# Patient Record
Sex: Female | Born: 1935 | Race: White | Hispanic: No | Marital: Married | Smoking: Former smoker
Health system: Southern US, Community
[De-identification: ages and names within clinical notes are randomized; demographics above are authoritative.]

## PROBLEM LIST (undated history)

## (undated) DIAGNOSIS — R0602 Shortness of breath: Secondary | ICD-10-CM

## (undated) DIAGNOSIS — F341 Dysthymic disorder: Secondary | ICD-10-CM

## (undated) DIAGNOSIS — M1990 Unspecified osteoarthritis, unspecified site: Secondary | ICD-10-CM

## (undated) DIAGNOSIS — E785 Hyperlipidemia, unspecified: Secondary | ICD-10-CM

## (undated) DIAGNOSIS — H6123 Impacted cerumen, bilateral: Secondary | ICD-10-CM

## (undated) DIAGNOSIS — H269 Unspecified cataract: Secondary | ICD-10-CM

## (undated) DIAGNOSIS — Z972 Presence of dental prosthetic device (complete) (partial): Secondary | ICD-10-CM

## (undated) DIAGNOSIS — N943 Premenstrual tension syndrome: Secondary | ICD-10-CM

## (undated) DIAGNOSIS — I10 Essential (primary) hypertension: Secondary | ICD-10-CM

## (undated) HISTORY — DX: Unspecified osteoarthritis, unspecified site: M19.90

## (undated) HISTORY — DX: Impacted cerumen, bilateral: H61.23

## (undated) HISTORY — PX: DILATION AND CURETTAGE OF UTERUS: SHX78

## (undated) HISTORY — PX: THYROID LOBECTOMY: SHX420

## (undated) HISTORY — DX: Shortness of breath: R06.02

## (undated) HISTORY — DX: Premenstrual tension syndrome: N94.3

## (undated) HISTORY — PX: COLONOSCOPY: SHX174

## (undated) HISTORY — DX: Dysthymic disorder: F34.1

## (undated) HISTORY — PX: WISDOM TOOTH EXTRACTION: SHX21

## (undated) HISTORY — PX: TONSILLECTOMY: SUR1361

## (undated) HISTORY — DX: Hyperlipidemia, unspecified: E78.5

## (undated) HISTORY — DX: Unspecified cataract: H26.9

## (undated) HISTORY — DX: Presence of dental prosthetic device (complete) (partial): Z97.2

## (undated) HISTORY — DX: Essential (primary) hypertension: I10

---

## 1998-10-11 ENCOUNTER — Other Ambulatory Visit: Admission: RE | Admit: 1998-10-11 | Discharge: 1998-10-11 | Payer: Self-pay | Admitting: Obstetrics and Gynecology

## 1999-09-16 ENCOUNTER — Ambulatory Visit (HOSPITAL_COMMUNITY): Admission: RE | Admit: 1999-09-16 | Discharge: 1999-09-16 | Payer: Self-pay | Admitting: *Deleted

## 1999-10-16 ENCOUNTER — Other Ambulatory Visit: Admission: RE | Admit: 1999-10-16 | Discharge: 1999-10-16 | Payer: Self-pay | Admitting: Obstetrics and Gynecology

## 1999-11-22 ENCOUNTER — Other Ambulatory Visit: Admission: RE | Admit: 1999-11-22 | Discharge: 1999-11-22 | Payer: Self-pay | Admitting: Obstetrics and Gynecology

## 1999-11-22 ENCOUNTER — Encounter (INDEPENDENT_AMBULATORY_CARE_PROVIDER_SITE_OTHER): Payer: Self-pay | Admitting: Specialist

## 2000-05-27 ENCOUNTER — Other Ambulatory Visit: Admission: RE | Admit: 2000-05-27 | Discharge: 2000-05-27 | Payer: Self-pay | Admitting: Obstetrics and Gynecology

## 2000-05-27 ENCOUNTER — Encounter (INDEPENDENT_AMBULATORY_CARE_PROVIDER_SITE_OTHER): Payer: Self-pay | Admitting: Specialist

## 2000-06-24 ENCOUNTER — Encounter: Payer: Self-pay | Admitting: *Deleted

## 2000-06-24 ENCOUNTER — Encounter: Admission: RE | Admit: 2000-06-24 | Discharge: 2000-06-24 | Payer: Self-pay | Admitting: *Deleted

## 2000-06-30 ENCOUNTER — Encounter: Payer: Self-pay | Admitting: *Deleted

## 2000-06-30 ENCOUNTER — Encounter: Admission: RE | Admit: 2000-06-30 | Discharge: 2000-06-30 | Payer: Self-pay | Admitting: *Deleted

## 2000-07-31 ENCOUNTER — Encounter: Payer: Self-pay | Admitting: *Deleted

## 2000-07-31 ENCOUNTER — Encounter: Admission: RE | Admit: 2000-07-31 | Discharge: 2000-07-31 | Payer: Self-pay | Admitting: *Deleted

## 2000-10-20 ENCOUNTER — Other Ambulatory Visit: Admission: RE | Admit: 2000-10-20 | Discharge: 2000-10-20 | Payer: Self-pay | Admitting: Obstetrics and Gynecology

## 2001-08-05 ENCOUNTER — Ambulatory Visit: Admission: RE | Admit: 2001-08-05 | Discharge: 2001-08-05 | Payer: Self-pay | Admitting: Family Medicine

## 2001-08-05 ENCOUNTER — Encounter: Payer: Self-pay | Admitting: Pulmonary Disease

## 2001-08-05 ENCOUNTER — Encounter: Payer: Self-pay | Admitting: Family Medicine

## 2001-11-08 ENCOUNTER — Other Ambulatory Visit: Admission: RE | Admit: 2001-11-08 | Discharge: 2001-11-08 | Payer: Self-pay | Admitting: Obstetrics and Gynecology

## 2002-12-20 ENCOUNTER — Other Ambulatory Visit: Admission: RE | Admit: 2002-12-20 | Discharge: 2002-12-20 | Payer: Self-pay | Admitting: Obstetrics and Gynecology

## 2004-02-07 ENCOUNTER — Other Ambulatory Visit: Admission: RE | Admit: 2004-02-07 | Discharge: 2004-02-07 | Payer: Self-pay | Admitting: Obstetrics and Gynecology

## 2004-10-07 ENCOUNTER — Ambulatory Visit: Payer: Self-pay | Admitting: Family Medicine

## 2005-01-14 ENCOUNTER — Ambulatory Visit: Payer: Self-pay | Admitting: Family Medicine

## 2005-05-12 ENCOUNTER — Ambulatory Visit: Payer: Self-pay | Admitting: Family Medicine

## 2005-08-26 ENCOUNTER — Ambulatory Visit: Payer: Self-pay | Admitting: Family Medicine

## 2005-10-02 ENCOUNTER — Ambulatory Visit: Payer: Self-pay | Admitting: Family Medicine

## 2005-11-13 ENCOUNTER — Encounter: Admission: RE | Admit: 2005-11-13 | Discharge: 2005-11-13 | Payer: Self-pay | Admitting: Family Medicine

## 2005-11-17 ENCOUNTER — Ambulatory Visit: Payer: Self-pay | Admitting: Family Medicine

## 2005-12-15 ENCOUNTER — Ambulatory Visit: Payer: Self-pay | Admitting: Family Medicine

## 2005-12-31 ENCOUNTER — Ambulatory Visit: Payer: Self-pay | Admitting: Family Medicine

## 2006-03-09 ENCOUNTER — Ambulatory Visit: Payer: Self-pay | Admitting: Family Medicine

## 2006-07-27 ENCOUNTER — Ambulatory Visit: Payer: Self-pay | Admitting: Family Medicine

## 2006-08-06 ENCOUNTER — Ambulatory Visit: Payer: Self-pay | Admitting: Family Medicine

## 2007-02-23 ENCOUNTER — Ambulatory Visit: Payer: Self-pay | Admitting: Family Medicine

## 2007-02-23 LAB — CONVERTED CEMR LAB
ALT: 20 units/L (ref 0–40)
AST: 30 units/L (ref 0–37)
Albumin: 4.4 g/dL (ref 3.5–5.2)
Alkaline Phosphatase: 60 units/L (ref 39–117)
BUN: 10 mg/dL (ref 6–23)
Basophils Absolute: 0 10*3/uL (ref 0.0–0.1)
Basophils Relative: 0.2 % (ref 0.0–1.0)
Bilirubin, Direct: 0.1 mg/dL (ref 0.0–0.3)
CO2: 32 meq/L (ref 19–32)
Calcium: 9.7 mg/dL (ref 8.4–10.5)
Chloride: 101 meq/L (ref 96–112)
Cholesterol: 188 mg/dL (ref 0–200)
Creatinine, Ser: 0.8 mg/dL (ref 0.4–1.2)
Eosinophils Absolute: 0.3 10*3/uL (ref 0.0–0.6)
Eosinophils Relative: 7.7 % — ABNORMAL HIGH (ref 0.0–5.0)
GFR calc Af Amer: 91 mL/min
GFR calc non Af Amer: 75 mL/min
Glucose, Bld: 95 mg/dL (ref 70–99)
HCT: 40.6 % (ref 36.0–46.0)
HDL: 73.1 mg/dL (ref >39.0)
Hemoglobin: 14 g/dL (ref 12.0–15.0)
LDL Cholesterol: 99 mg/dL (ref 0–99)
Lymphocytes Relative: 27.3 % (ref 12.0–46.0)
MCHC: 34.5 g/dL (ref 30.0–36.0)
MCV: 96.2 fL (ref 78.0–100.0)
Monocytes Absolute: 0.4 10*3/uL (ref 0.2–0.7)
Monocytes Relative: 8.4 % (ref 3.0–11.0)
Neutro Abs: 2.6 10*3/uL (ref 1.4–7.7)
Neutrophils Relative %: 56.4 % (ref 43.0–77.0)
Platelets: 249 10*3/uL (ref 150–400)
Potassium: 4.1 meq/L (ref 3.5–5.1)
RBC: 4.22 M/uL (ref 3.87–5.11)
RDW: 12.1 % (ref 11.5–14.6)
Sodium: 138 meq/L (ref 135–145)
TSH: 2.49 microintl units/mL (ref 0.35–5.50)
Total Bilirubin: 1.1 mg/dL (ref 0.3–1.2)
Total CHOL/HDL Ratio: 2.6
Total Protein: 7.3 g/dL (ref 6.0–8.3)
Triglycerides: 78 mg/dL (ref 0–149)
VLDL: 16 mg/dL (ref 0–40)
WBC: 4.5 10*3/uL (ref 4.5–10.5)

## 2007-08-09 DIAGNOSIS — J309 Allergic rhinitis, unspecified: Secondary | ICD-10-CM | POA: Insufficient documentation

## 2007-08-09 DIAGNOSIS — I10 Essential (primary) hypertension: Secondary | ICD-10-CM

## 2007-08-09 DIAGNOSIS — M1990 Unspecified osteoarthritis, unspecified site: Secondary | ICD-10-CM | POA: Insufficient documentation

## 2007-09-28 ENCOUNTER — Ambulatory Visit: Payer: Self-pay | Admitting: Family Medicine

## 2007-09-28 DIAGNOSIS — H6120 Impacted cerumen, unspecified ear: Secondary | ICD-10-CM

## 2008-02-07 ENCOUNTER — Encounter: Payer: Self-pay | Admitting: Family Medicine

## 2008-02-08 ENCOUNTER — Ambulatory Visit: Payer: Self-pay | Admitting: Family Medicine

## 2008-02-08 DIAGNOSIS — E785 Hyperlipidemia, unspecified: Secondary | ICD-10-CM

## 2008-02-08 LAB — CONVERTED CEMR LAB
ALT: 23 U/L
AST: 28 U/L
Albumin: 4.1 g/dL
Alkaline Phosphatase: 63 U/L
BUN: 14 mg/dL
Basophils Absolute: 0 K/uL
Basophils Relative: 0.9 %
Bilirubin Urine: NEGATIVE
Bilirubin, Direct: 0.2 mg/dL
Blood in Urine, dipstick: NEGATIVE
CO2: 30 meq/L
Calcium: 9.8 mg/dL
Chloride: 101 meq/L
Cholesterol: 182 mg/dL
Creatinine, Ser: 0.8 mg/dL
Eosinophils Absolute: 0.6 K/uL
Eosinophils Relative: 13.8 % — ABNORMAL HIGH
GFR calc Af Amer: 91 mL/min
GFR calc non Af Amer: 75 mL/min
Glucose, Bld: 91 mg/dL
Glucose, Urine, Semiquant: NEGATIVE
HCT: 41.2 %
HDL: 56.7 mg/dL
Hemoglobin: 13.8 g/dL
Ketones, urine, test strip: NEGATIVE
LDL Cholesterol: 109 mg/dL — ABNORMAL HIGH
Lymphocytes Relative: 31.2 %
MCHC: 33.5 g/dL
MCV: 96.1 fL
Monocytes Absolute: 0.4 K/uL
Monocytes Relative: 9 %
Neutro Abs: 1.9 K/uL
Neutrophils Relative %: 45.1 %
Nitrite: NEGATIVE
Platelets: 244 K/uL
Potassium: 4.9 meq/L
Protein, U semiquant: NEGATIVE
RBC: 4.29 M/uL
RDW: 11.2 % — ABNORMAL LOW
Sodium: 137 meq/L
Specific Gravity, Urine: 1.015
TSH: 3.17 u[IU]/mL
Total Bilirubin: 0.9 mg/dL
Total CHOL/HDL Ratio: 3.2
Total Protein: 7.1 g/dL
Triglycerides: 81 mg/dL
Urobilinogen, UA: 0.2
VLDL: 16 mg/dL
WBC Urine, dipstick: NEGATIVE
WBC: 4.2 10*3/microliter — ABNORMAL LOW
pH: 8

## 2008-02-25 ENCOUNTER — Telehealth: Payer: Self-pay | Admitting: Family Medicine

## 2008-03-02 ENCOUNTER — Ambulatory Visit: Payer: Self-pay | Admitting: Family Medicine

## 2008-07-12 ENCOUNTER — Ambulatory Visit: Payer: Self-pay | Admitting: Family Medicine

## 2008-07-12 DIAGNOSIS — R0602 Shortness of breath: Secondary | ICD-10-CM | POA: Insufficient documentation

## 2008-07-12 DIAGNOSIS — F341 Dysthymic disorder: Secondary | ICD-10-CM

## 2008-07-27 ENCOUNTER — Ambulatory Visit: Payer: Self-pay | Admitting: Family Medicine

## 2008-08-02 ENCOUNTER — Ambulatory Visit: Payer: Self-pay | Admitting: Cardiology

## 2008-08-18 ENCOUNTER — Ambulatory Visit: Payer: Self-pay | Admitting: Cardiology

## 2009-01-25 ENCOUNTER — Encounter: Payer: Self-pay | Admitting: Family Medicine

## 2009-02-09 ENCOUNTER — Encounter: Payer: Self-pay | Admitting: Cardiology

## 2009-02-09 ENCOUNTER — Ambulatory Visit: Payer: Self-pay | Admitting: Cardiology

## 2009-02-23 ENCOUNTER — Ambulatory Visit (HOSPITAL_COMMUNITY): Admission: RE | Admit: 2009-02-23 | Discharge: 2009-02-23 | Payer: Self-pay | Admitting: Cardiology

## 2009-02-23 ENCOUNTER — Encounter: Payer: Self-pay | Admitting: Cardiology

## 2009-02-23 ENCOUNTER — Ambulatory Visit: Payer: Self-pay | Admitting: Internal Medicine

## 2009-02-23 ENCOUNTER — Encounter: Payer: Self-pay | Admitting: Pulmonary Disease

## 2009-05-14 ENCOUNTER — Telehealth: Payer: Self-pay | Admitting: Family Medicine

## 2009-06-20 ENCOUNTER — Encounter (INDEPENDENT_AMBULATORY_CARE_PROVIDER_SITE_OTHER): Payer: Self-pay | Admitting: *Deleted

## 2009-08-06 ENCOUNTER — Ambulatory Visit: Payer: Self-pay | Admitting: Family Medicine

## 2009-08-06 LAB — CONVERTED CEMR LAB
AST: 28 units/L (ref 0–37)
BUN: 14 mg/dL (ref 6–23)
Basophils Absolute: 0 10*3/uL (ref 0.0–0.1)
Bilirubin Urine: NEGATIVE
Calcium: 9.7 mg/dL (ref 8.4–10.5)
Cholesterol: 163 mg/dL (ref 0–200)
Creatinine, Ser: 0.7 mg/dL (ref 0.4–1.2)
GFR calc non Af Amer: 87.16 mL/min (ref >60)
Glucose, Bld: 113 mg/dL — ABNORMAL HIGH (ref 70–99)
Glucose, Urine, Semiquant: NEGATIVE
HCT: 41.3 % (ref 36.0–46.0)
HDL: 55.8 mg/dL (ref >39.00)
Ketones, urine, test strip: NEGATIVE
Lymphocytes Relative: 26.7 % (ref 12.0–46.0)
Lymphs Abs: 1.3 10*3/uL (ref 0.7–4.0)
Monocytes Relative: 10.4 % (ref 3.0–12.0)
Neutrophils Relative %: 54.2 % (ref 43.0–77.0)
Nitrite: NEGATIVE
Platelets: 234 10*3/uL (ref 150.0–400.0)
RDW: 12.1 % (ref 11.5–14.6)
Specific Gravity, Urine: 1.015
TSH: 2.82 microintl units/mL (ref 0.35–5.50)
Total Bilirubin: 1 mg/dL (ref 0.3–1.2)
Triglycerides: 86 mg/dL (ref 0.0–149.0)
Urobilinogen, UA: 0.2
VLDL: 17.2 mg/dL (ref 0.0–40.0)
WBC Urine, dipstick: NEGATIVE
pH: 8.5

## 2009-08-09 ENCOUNTER — Telehealth: Payer: Self-pay | Admitting: Family Medicine

## 2009-08-10 ENCOUNTER — Ambulatory Visit: Payer: Self-pay | Admitting: Family Medicine

## 2009-08-16 ENCOUNTER — Telehealth: Payer: Self-pay | Admitting: Family Medicine

## 2009-08-17 ENCOUNTER — Ambulatory Visit: Payer: Self-pay | Admitting: Pulmonary Disease

## 2009-08-26 ENCOUNTER — Encounter: Payer: Self-pay | Admitting: Pulmonary Disease

## 2009-08-28 ENCOUNTER — Ambulatory Visit: Payer: Self-pay | Admitting: Cardiology

## 2009-08-28 DIAGNOSIS — R002 Palpitations: Secondary | ICD-10-CM | POA: Insufficient documentation

## 2009-08-30 ENCOUNTER — Telehealth: Payer: Self-pay | Admitting: Family Medicine

## 2009-09-21 ENCOUNTER — Ambulatory Visit: Payer: Self-pay | Admitting: Pulmonary Disease

## 2009-09-21 DIAGNOSIS — J45909 Unspecified asthma, uncomplicated: Secondary | ICD-10-CM | POA: Insufficient documentation

## 2009-10-03 ENCOUNTER — Ambulatory Visit: Payer: Self-pay | Admitting: Cardiology

## 2009-10-07 LAB — CONVERTED CEMR LAB
CO2: 24 meq/L (ref 19–32)
Chloride: 101 meq/L (ref 96–112)
Creatinine, Ser: 0.9 mg/dL (ref 0.4–1.2)

## 2009-12-21 ENCOUNTER — Encounter: Payer: Self-pay | Admitting: Cardiology

## 2009-12-25 ENCOUNTER — Ambulatory Visit: Payer: Self-pay | Admitting: Cardiology

## 2010-02-11 ENCOUNTER — Encounter: Payer: Self-pay | Admitting: Family Medicine

## 2010-11-03 DIAGNOSIS — H269 Unspecified cataract: Secondary | ICD-10-CM

## 2010-11-03 HISTORY — PX: OTHER SURGICAL HISTORY: SHX169

## 2010-11-03 HISTORY — DX: Unspecified cataract: H26.9

## 2010-12-03 NOTE — Miscellaneous (Signed)
  Clinical Lists Changes  Observations: Added new observation of PFT RSLT: 1. Mild obstruciton by FEV, and no response to bronchodilator 2. No restrictions, but no airtrapping noted 3. DLCO is normal. (09/21/2009 11:05)      PFT  Procedure date:  09/21/2009  Findings:      1. Mild obstruciton by FEV, and no response to bronchodilator 2. No restrictions, but no airtrapping noted 3. DLCO is normal.

## 2010-12-03 NOTE — Assessment & Plan Note (Signed)
Summary: 4 month rov/sl  Medications Added LOSARTAN POTASSIUM 100 MG TABS (LOSARTAN POTASSIUM) one daily      Allergies Added:   Visit Type:  Follow-up Primary Provider:  Roderick Pee MD  CC:  Dyspnea.  History of Present Illness: The patient presents for followup of her hypertension and dyspnea. She continues to complain about dyspnea with exertion. She has seen Dr. Shelle Iron and is being treated for mild questionable asthma. She also had a distant smoking history which may contribute. Cardiopulmonary stress testing has not suggested a cardiac etiology other than possible diastolic dysfunction. Her BNP has been slightly elevated. Her blood pressures have been slightly elevated. At the last visit I added Aldactone and her blood pressures have come down to consistently be below 140/90 and in fact in the mornings the systolics are less than 110 and she feels slightly lightheaded. She also reports that she is "dry" and needing to drink fluids. Despite dual diuretics and blood pressure lowering she has not had improvement in her dyspnea. It is no worse. She is not having PND or orthopnea. She is not having chest pressure, neck or arm discomfort. She has no palpitations, presyncope or syncope and is having no edema.  Current Medications (verified): 1)  Bayer Aspirin 325 Mg  Tabs (Aspirin) .... Take 2 Once Daily 2)  Hyzaar 100-25 Mg  Tabs (Losartan Potassium-Hctz) .... Take 1 Tablet By Mouth Once A Day 3)  Simvastatin 40 Mg Tabs (Simvastatin) .... Take 1 Once Dailypt Still Taking 20mg  Once Daily At Night 4)  Ca+ & Vit D 5)  Ginkgo 60 Mg Tabs (Ginkgo Biloba) 6)  Vit E 7)  Atenolol 50 Mg Tabs (Atenolol) .Marland Kitchen.. 1 At Bedtime 8)  Centrum Silver  Tabs (Multiple Vitamins-Minerals) .... Take 1 Once Daily 9)  Fish Oil 1200 Mg Caps (Omega-3 Fatty Acids) .... Take 1 Once Daily 10)  Atenolol 25 Mg Tabs (Atenolol) .Marland Kitchen.. 1 Qam 11)  Advil 200 Mg Tabs (Ibuprofen) .... Prn 12)  Spironolactone 25 Mg Tabs  (Spironolactone) .... One By Mouth Daily  Allergies (verified): 1)  ! Codeine  Past History:  Past Medical History: Reviewed history from 08/28/2009 and no changes required. DYSTHYMIC DISORDER (ICD-300.4) HYPERLIPIDEMIA (ICD-272.4) CERUMEN IMPACTION, BILATERAL (ICD-380.4) FAMILY HISTORY BREAST CANCER 1ST DEGREE RELATIVE <50 (ICD-V16.3) OSTEOARTHRITIS (ICD-715.90) HYPERTENSION (ICD-401.9) ALLERGIC RHINITIS (ICD-477.9) PMS Hearing Loss  Past Surgical History: Reviewed history from 08/09/2007 and no changes required. Colonoscopy CB x3 D/C x1 Lumpectomy - Thyroid  Review of Systems       As stated in the HPI and negative for all other systems.   Vital Signs:  Patient profile:   77 year old female Height:      62 inches Weight:      145 pounds BMI:     26.62 Pulse rate:   62 / minute Resp:     16 per minute BP sitting:   104 / 68  (right arm)  Vitals Entered By: Marrion Coy, CNA (December 25, 2009 8:49 AM)  Physical Exam  General:  Well developed, well nourished, in no acute distress. Head:  normocephalic and atraumatic Eyes:  PERRLA/EOM intact; conjunctiva and lids normal. Mouth:  Teeth, gums and palate normal. Oral mucosa normal. Neck:  Neck supple, no JVD. No masses, thyromegaly or abnormal cervical nodes. Chest Wall:  no deformities or breast masses noted Lungs:  Clear bilaterally to auscultation and percussion. Abdomen:  Bowel sounds positive; abdomen soft and non-tender without masses, organomegaly, or hernias noted. No hepatosplenomegaly.  Msk:  Back normal, normal gait. Muscle strength and tone normal. Extremities:  No clubbing or cyanosis. Neurologic:  Alert and oriented x 3. Skin:  Intact without lesions or rashes. Cervical Nodes:  no significant adenopathy Axillary Nodes:  no significant adenopathy Inguinal Nodes:  no significant adenopathy Psych:  Normal affect.   Detailed Cardiovascular Exam  Neck    Carotids: Carotids full and equal  bilaterally without bruits.      Neck Veins: Normal, no JVD.    Heart    Inspection: no deformities or lifts noted.      Palpation: normal PMI with no thrills palpable.      Auscultation: regular rate and rhythm, S1, S2 without murmurs, rubs, gallops, or clicks.    Vascular    Abdominal Aorta: no palpable masses, pulsations, or audible bruits.      Femoral Pulses: normal femoral pulses bilaterally.      Pedal Pulses: normal pedal pulses bilaterally.      Radial Pulses: normal radial pulses bilaterally.      Peripheral Circulation: no clubbing, cyanosis, or edema noted with normal capillary refill.     EKG  Procedure date:  12/25/2009  Findings:      sinus rhythm, rate 60 to, axis within normal limits, intervals within normal limits, premature atrial contractions, no acute ST-T wave changes.  Impression & Recommendations:  Problem # 1:  SOB (ICD-786.05) The etiology is probably multifactorial though I am disappointed that treatment of blood pressure and diastolic dysfunction with diuretics did not make a significant difference. At this point I will back off on her diuretics as described below. No further workup is planned.  Problem # 2:  HYPERTENSION (ICD-401.9)  I will discontinue the HCTZ and have her on one the diuretic only. Her blood pressures are better controlled on Aldactone. She will continue this.  Her updated medication list for this problem includes:    Bayer Aspirin 325 Mg Tabs (Aspirin) .Marland Kitchen... Take 2 once daily    Losartan Potassium 100 Mg Tabs (Losartan potassium) ..... One daily    Atenolol 50 Mg Tabs (Atenolol) .Marland Kitchen... 1 at bedtime    Atenolol 25 Mg Tabs (Atenolol) .Marland Kitchen... 1 qam    Spironolactone 25 Mg Tabs (Spironolactone) ..... One by mouth daily  Problem # 3:  PALPITATIONS (ICD-785.1) She is not particularly bothered by this and will continue with the meds as listed. Orders: EKG w/ Interpretation (93000)  Patient Instructions: 1)  Your physician recommends  that you schedule a follow-up appointment in: 12 months 2)  Your physician has recommended you make the following change in your medication:  Prescriptions: SPIRONOLACTONE 25 MG TABS (SPIRONOLACTONE) one by mouth daily  #90 x 3   Entered by:   Charolotte Capuchin, RN   Authorized by:   Rollene Rotunda, MD, [facility]   Signed by:   Charolotte Capuchin, RN on 12/25/2009   Method used:   Electronically to        [facility]  [phone]* (retail)       [address] [place], [place]  [address]       Ph: [phone] or [phone]       Fax: [phone]   RxID:   [id number] ATENOLOL 25 MG TABS (ATENOLOL) 1 qam  #90 x 3   Entered by:   Charolotte Capuchin, RN   Authorized by:   Rollene Rotunda, MD, [facility]   Signed by:   Charolotte Capuchin, RN on 12/25/2009  Method used:   Electronically to        [facility]  [phone]* (retail)       [address] [place], [place]  [address]       Ph: [phone] or [phone]       Fax: [phone]   RxID:   [id number] ATENOLOL 50 MG TABS (ATENOLOL) 1 at bedtime  #90 x 3   Entered by:   Charolotte Capuchin, RN   Authorized by:   Rollene Rotunda, MD, [facility]   Signed by:   Charolotte Capuchin, RN on 12/25/2009   Method used:   Electronically to        [facility]  [phone]* (retail)       [address] [place], [place]  [address]       Ph: [phone] or [phone]       Fax: [phone]   RxID:   [id number] SIMVASTATIN 40 MG TABS (SIMVASTATIN) Take 1 once dailypt still taking 20mg  once daily at night  #90 x 3   Entered by:   Charolotte Capuchin, RN   Authorized by:   Rollene Rotunda, MD, [facility]   Signed by:   Charolotte Capuchin, RN on 12/25/2009   Method used:   Electronically to        [facility]  [phone]* (retail)       [address] [place], [place]  [address]       Ph: [phone] or [phone]       Fax: [phone]   RxID:   [id number] LOSARTAN POTASSIUM 100 MG TABS  (LOSARTAN POTASSIUM) one daily  #90 x 3   Entered by:   Charolotte Capuchin, RN   Authorized by:   Rollene Rotunda, MD, [facility]   Signed by:   Charolotte Capuchin, RN on 12/25/2009   Method used:   Electronically to        [facility]  [phone]* (retail)       [address] [place], [place]  [address]       Ph: [phone] or [phone]       Fax: [phone]   RxID:   [id number]

## 2011-01-30 ENCOUNTER — Telehealth: Payer: Self-pay | Admitting: Cardiology

## 2011-01-30 DIAGNOSIS — I10 Essential (primary) hypertension: Secondary | ICD-10-CM

## 2011-01-30 MED ORDER — ATENOLOL 25 MG PO TABS: 25.0000 mg | ORAL_TABLET | Freq: Every day | ORAL | Status: DC

## 2011-01-30 NOTE — Telephone Encounter (Signed)
Called and told pharmacy to use #90 instead of #30

## 2011-01-30 NOTE — Telephone Encounter (Signed)
[facility] 25mg  90 days supply

## 2011-02-19 ENCOUNTER — Encounter: Payer: Self-pay | Admitting: Family Medicine

## 2011-03-04 ENCOUNTER — Encounter: Payer: Self-pay | Admitting: Cardiology

## 2011-03-06 ENCOUNTER — Ambulatory Visit (INDEPENDENT_AMBULATORY_CARE_PROVIDER_SITE_OTHER): Payer: Medicare Other | Admitting: Cardiology

## 2011-03-06 ENCOUNTER — Encounter: Payer: Self-pay | Admitting: Cardiology

## 2011-03-06 ENCOUNTER — Ambulatory Visit: Payer: Self-pay | Admitting: Cardiology

## 2011-03-06 VITALS — BP 130/76 | HR 64 | Ht 62.0 in | Wt 139.0 lb

## 2011-03-06 DIAGNOSIS — R0602 Shortness of breath: Secondary | ICD-10-CM

## 2011-03-06 DIAGNOSIS — I10 Essential (primary) hypertension: Secondary | ICD-10-CM

## 2011-03-06 DIAGNOSIS — R002 Palpitations: Secondary | ICD-10-CM

## 2011-03-06 LAB — BASIC METABOLIC PANEL
BUN: 9 mg/dL (ref 6–23)
Chloride: 96 mEq/L (ref 96–112)
Creatinine, Ser: 0.8 mg/dL (ref 0.4–1.2)
GFR: 80.14 mL/min (ref >60.00)

## 2011-03-06 NOTE — Progress Notes (Signed)
HPI Patient presents for followup of dyspnea hypertension. At the last visit I stopped hydrochlorothiazide started spironolactone. She has actually done better from a blood pressure standpoint with this. She has had her usual mild dyspnea but this is not worse than previous. She has no chest pressure, neck or arm discomfort. She has not had any palpitations, presyncope or syncope. She has had no weight gain or edema. She does have some hoarseness and difficulty singing.  Allergies  Allergen Reactions  . Codeine     Current Outpatient Prescriptions  Medication Sig Dispense Refill  . atenolol (TENORMIN) 50 MG tablet Take 50mg  tab in the am and a 25mg  tab in the pm       . Calcium Carbonate-Vitamin D (CALCIUM + D PO) Take by mouth daily.        . Ginkgo Biloba 60 MG CAPS Take by mouth daily.        Marland Kitchen ibuprofen (ADVIL,MOTRIN) 200 MG tablet Take 200 mg by mouth every 6 (six) hours as needed.        . Multiple Vitamin (MULTIVITAMIN) tablet Take 1 tablet by mouth daily.        . simvastatin (ZOCOR) 40 MG tablet Take 40 mg by mouth at bedtime.        Marland Kitchen spironolactone (ALDACTONE) 25 MG tablet Take 25 mg by mouth daily.        . vitamin E 400 UNIT capsule Take 400 Units by mouth daily.        Marland Kitchen aspirin 325 MG EC tablet Take 325 mg by mouth daily.        Marland Kitchen atenolol (TENORMIN) 25 MG tablet Take 50 mg by mouth daily.        Marland Kitchen atenolol (TENORMIN) 50 MG tablet Take 50 mg by mouth at bedtime.        Marland Kitchen losartan (COZAAR) 100 MG tablet Take 100 mg by mouth daily.        . Omega-3 Fatty Acids (FISH OIL) 1200 MG CAPS Take by mouth daily.        Marland Kitchen DISCONTD: atenolol (TENORMIN) 25 MG tablet Take 1 tablet (25 mg total) by mouth daily.  90 tablet  3    Past Medical History  Diagnosis Date  . Dysthymic disorder   . HLD (hyperlipidemia)   . Bilateral impacted cerumen   . Osteoarthritis   . HTN (hypertension)   . Allergic rhinitis   . PMS (premenstrual syndrome)   . Hearing loss     Past Surgical History    Procedure Date  . Dilation and curettage of uterus   . Thyroid lobectomy     ROS:  As stated in the HPI and negative for all other systems.  PHYSICAL EXAM BP 130/76  Pulse 64  Ht 5\' 2"  (1.575 m)  Wt 139 lb (63.05 kg)  BMI 25.42 kg/m2 GENERAL:  Well appearing HEENT:  Pupils equal round and reactive, fundi not visualized, oral mucosa unremarkable NECK:  No jugular venous distention, waveform within normal limits, carotid upstroke brisk and symmetric, no bruits, no thyromegaly LYMPHATICS:  No cervical, inguinal adenopathy LUNGS:  Clear to auscultation bilaterally BACK:  No CVA tenderness CHEST:  Unremarkable HEART:  PMI not displaced or sustained,S1 and S2 within normal limits, no S3, no S4, no clicks, no rubs, no murmurs ABD:  Flat, positive bowel sounds normal in frequency in pitch, no bruits, no rebound, no guarding, no midline pulsatile mass, no hepatomegaly, no splenomegaly EXT:  2 plus pulses throughout,  no edema, no cyanosis no clubbing SKIN:  No rashes no nodules NEURO:  Cranial nerves II through XII grossly intact, motor grossly intact throughout PSYCH:  Cognitively intact, oriented to person place and time   EKG: Sinus rhythm, rate 64, axis within normal limits, intervals within normal limits, no acute ST-T wave changes.   ASSESSMENT AND PLAN

## 2011-03-06 NOTE — Assessment & Plan Note (Signed)
At this morning and her breathing is at baseline. She had extensive cardiac workup including cardiopulmonary stress testing. She has seen a pulmonologist. No further testing is suggested. We discussed her hoarseness which could be related to medications. However, she wants to continue the meds as listed since her blood pressure is so well controlled.

## 2011-03-06 NOTE — Assessment & Plan Note (Signed)
The blood pressure is at target. No change in medications is indicated. We will continue with therapeutic lifestyle changes (TLC).  I will check a BMET today.

## 2011-03-06 NOTE — Patient Instructions (Addendum)
Have blood work today (basic metabolic panel) Follow up in 1 year with Dr Antoine Poche

## 2011-03-08 ENCOUNTER — Other Ambulatory Visit: Payer: Self-pay | Admitting: Cardiology

## 2011-03-11 NOTE — Progress Notes (Signed)
Addended by: Rocco Serene on: 03/11/2011 05:38 PM   Modules accepted: Orders

## 2011-03-12 ENCOUNTER — Telehealth: Payer: Self-pay | Admitting: Cardiology

## 2011-03-12 DIAGNOSIS — E785 Hyperlipidemia, unspecified: Secondary | ICD-10-CM

## 2011-03-12 NOTE — Telephone Encounter (Signed)
Calling back to set up lab work.

## 2011-03-12 NOTE — Telephone Encounter (Signed)
Left a message for pt to give me a call back need to verify what dose of simvastatin he"s on

## 2011-03-12 NOTE — Telephone Encounter (Signed)
Left message for pt to call back to schedule repeat BMP due in 2 weeks

## 2011-03-13 MED ORDER — SIMVASTATIN 40 MG PO TABS: 40.0000 mg | ORAL_TABLET | Freq: Every day | ORAL | Status: DC

## 2011-03-13 NOTE — Telephone Encounter (Signed)
Refilled Zocor medication. We will repeat BMP on 03/18/11.

## 2011-03-13 NOTE — Telephone Encounter (Signed)
Per pt calling. Leaving town in the am . Refill on medication Simvastatin [facility] mg. [facility] [address] [phone].  Also need dx code for lab work .

## 2011-03-18 ENCOUNTER — Other Ambulatory Visit: Payer: Medicare Other | Admitting: *Deleted

## 2011-03-18 NOTE — Procedures (Signed)
Terri Wiggins                              EXERCISE TREADMILL   [name], [name]                      MRN:          [id number]  DATE:08/18/2008                            DOB:          [birth year]    PROCEDURE:  Exercise treadmill test.   PRIMARY CARE PHYSICIAN:  Tinnie Gens A. Tawanna Cooler, MD   INDICATIONS:  Evaluate the patient with dyspnea and multiple  cardiovascular risk factors.   PROCEDURE NOTE:  The patient was exercised using standard Bruce  protocol.  She was only able to exercise for 6 minutes.  This completed  stage II.  I stopped the test because of fatigue and because she had  achieved a target heart rate.  Her maximum heart rate was 150 which was  101% of predicted.  She achieved 7.0 mets.  She did have a somewhat  accelerated blood pressure response with maximum of 180/101.  She had  ventricular ectopy at the beginning of the stress tests that actually  went away with peak exercise.  She did not have any chest pain.  She had  accelerated dyspnea, I thought all are proportion to her level of  exertion, but recovered quickly.  She has had no ischemic ST-T wave  changes.  She had a normal heart rate recovery.   CONCLUSION:  Negative adequate exercise treadmill test with a moderate  exercise tolerance.  There were no high-risk features consistent with  obstructive coronary disease.  I went back and reviewed her previous  stress test 7 years ago and she was able to go for 3 more minutes.  At  this point, I have instructed her on an exercise regimen.  I have  started her on Tenormin 25 mg at night since her blood pressure is  somewhat accelerated and has been elevated at rest as well.  This may  also help her tachycardia and PVCs.  If however, she does not have  improvement in her exercise tolerance with an exercise regimen and also  was getting more dyspnea, I would need to see her back and probably  would do a cardiopulmonary stress  test.     Rollene Rotunda, MD, [facility]  Electronically Signed    JH/MedQ  DD: 08/18/2008  DT: 08/19/2008  Job #: [id number]   cc:   Tinnie Gens A. Tawanna Cooler, MD
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
## 2011-03-27 ENCOUNTER — Other Ambulatory Visit (INDEPENDENT_AMBULATORY_CARE_PROVIDER_SITE_OTHER): Payer: Medicare Other | Admitting: *Deleted

## 2011-03-27 DIAGNOSIS — I10 Essential (primary) hypertension: Secondary | ICD-10-CM

## 2011-03-27 DIAGNOSIS — R0602 Shortness of breath: Secondary | ICD-10-CM

## 2011-03-27 LAB — BASIC METABOLIC PANEL
BUN: 14 mg/dL (ref 6–23)
CO2: 28 mEq/L (ref 19–32)
Chloride: 100 mEq/L (ref 96–112)
Glucose, Bld: 100 mg/dL — ABNORMAL HIGH (ref 70–99)
Potassium: 6.1 mEq/L (ref 3.5–5.1)

## 2011-03-28 ENCOUNTER — Telehealth: Payer: Self-pay | Admitting: *Deleted

## 2011-03-28 DIAGNOSIS — E875 Hyperkalemia: Secondary | ICD-10-CM

## 2011-03-28 MED ORDER — SODIUM POLYSTYRENE SULFONATE 15 GM/60ML PO SUSP: 15.0000 g | Freq: Once | ORAL | Status: AC

## 2011-03-28 NOTE — Telephone Encounter (Signed)
Kayexalate and follow up BMP order d/t hyperkalemia

## 2011-04-01 ENCOUNTER — Other Ambulatory Visit (INDEPENDENT_AMBULATORY_CARE_PROVIDER_SITE_OTHER): Payer: Medicare Other | Admitting: *Deleted

## 2011-04-01 DIAGNOSIS — E875 Hyperkalemia: Secondary | ICD-10-CM

## 2011-04-01 LAB — BASIC METABOLIC PANEL
CO2: 27 mEq/L (ref 19–32)
Calcium: 9 mg/dL (ref 8.4–10.5)
Creatinine, Ser: 0.7 mg/dL (ref 0.4–1.2)
Glucose, Bld: 84 mg/dL (ref 70–99)

## 2011-07-10 ENCOUNTER — Other Ambulatory Visit: Payer: Self-pay | Admitting: *Deleted

## 2011-07-10 MED ORDER — ATENOLOL 50 MG PO TABS: ORAL_TABLET | ORAL | Status: DC

## 2011-09-24 ENCOUNTER — Other Ambulatory Visit: Payer: Self-pay | Admitting: Cardiology

## 2011-11-19 DIAGNOSIS — H60399 Other infective otitis externa, unspecified ear: Secondary | ICD-10-CM | POA: Diagnosis not present

## 2011-12-30 DIAGNOSIS — J40 Bronchitis, not specified as acute or chronic: Secondary | ICD-10-CM | POA: Diagnosis not present

## 2012-01-01 ENCOUNTER — Telehealth: Payer: Self-pay | Admitting: *Deleted

## 2012-01-01 MED ORDER — ATENOLOL 50 MG PO TABS: ORAL_TABLET | ORAL | Status: DC

## 2012-01-01 NOTE — Telephone Encounter (Signed)
Pt needs refill on [facility]ol [facility]

## 2012-01-07 ENCOUNTER — Encounter (HOSPITAL_COMMUNITY): Payer: Self-pay | Admitting: Emergency Medicine

## 2012-01-07 ENCOUNTER — Emergency Department (INDEPENDENT_AMBULATORY_CARE_PROVIDER_SITE_OTHER)
Admission: EM | Admit: 2012-01-07 | Discharge: 2012-01-07 | Disposition: A | Discharge status: Home Health | Payer: Medicare Other | Source: Home / Self Care | Attending: Emergency Medicine | Admitting: Emergency Medicine

## 2012-01-07 ENCOUNTER — Other Ambulatory Visit: Payer: Self-pay | Admitting: Cardiology

## 2012-01-07 DIAGNOSIS — S139XXA Sprain of joints and ligaments of unspecified parts of neck, initial encounter: Secondary | ICD-10-CM | POA: Diagnosis not present

## 2012-01-07 DIAGNOSIS — S161XXA Strain of muscle, fascia and tendon at neck level, initial encounter: Secondary | ICD-10-CM

## 2012-01-07 DIAGNOSIS — S0990XA Unspecified injury of head, initial encounter: Secondary | ICD-10-CM

## 2012-01-07 MED ORDER — METHOCARBAMOL 500 MG PO TABS: 500.0000 mg | ORAL_TABLET | Freq: Three times a day (TID) | ORAL | Status: AC
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
## 2012-01-07 NOTE — Discharge Instructions (Signed)

## 2012-01-21 DIAGNOSIS — J320 Chronic maxillary sinusitis: Secondary | ICD-10-CM | POA: Diagnosis not present

## 2012-01-23 DIAGNOSIS — Z79899 Other long term (current) drug therapy: Secondary | ICD-10-CM | POA: Diagnosis not present

## 2012-01-23 DIAGNOSIS — E7800 Pure hypercholesterolemia, unspecified: Secondary | ICD-10-CM | POA: Diagnosis not present

## 2012-01-23 DIAGNOSIS — I10 Essential (primary) hypertension: Secondary | ICD-10-CM | POA: Diagnosis not present

## 2012-01-29 DIAGNOSIS — R7301 Impaired fasting glucose: Secondary | ICD-10-CM | POA: Diagnosis not present

## 2012-01-31 ENCOUNTER — Other Ambulatory Visit: Payer: Self-pay | Admitting: Cardiology

## 2012-02-26 DIAGNOSIS — Z803 Family history of malignant neoplasm of breast: Secondary | ICD-10-CM | POA: Diagnosis not present

## 2012-02-26 DIAGNOSIS — Z1231 Encounter for screening mammogram for malignant neoplasm of breast: Secondary | ICD-10-CM | POA: Diagnosis not present

## 2012-03-04 ENCOUNTER — Other Ambulatory Visit: Payer: Self-pay | Admitting: Cardiology

## 2012-03-04 NOTE — Telephone Encounter (Signed)
..   Requested Prescriptions   Signed Prescriptions Disp Refills  . simvastatin (ZOCOR) 40 MG tablet 90 tablet 0    Sig: TAKE 1 TABLET AT BEDTIME    Authorizing Provider: Rollene Rotunda    Ordering User: Christella Hartigan, Summerlynn Glauser Judie Petit

## 2012-03-23 ENCOUNTER — Ambulatory Visit (INDEPENDENT_AMBULATORY_CARE_PROVIDER_SITE_OTHER): Payer: Medicare Other | Admitting: Cardiology

## 2012-03-23 ENCOUNTER — Other Ambulatory Visit: Payer: Self-pay | Admitting: Cardiology

## 2012-03-23 ENCOUNTER — Encounter: Payer: Self-pay | Admitting: Cardiology

## 2012-03-23 VITALS — BP 141/85 | HR 65 | Resp 18 | Ht 63.0 in | Wt 134.0 lb

## 2012-03-23 DIAGNOSIS — I10 Essential (primary) hypertension: Secondary | ICD-10-CM

## 2012-03-23 NOTE — Progress Notes (Signed)
HPI Patient presents for followup of dyspnea hypertension. Since I last saw her she has done relatively well. She has been preoccupied by her daughter's wedding and her husband being ill. She's not had any acute complaints. She brings a good blood pressure diary and though it fluctuates her blood pressure is typically well controlled. She denies any chest pressure, neck or arm discomfort. She has had no shortness of breath, PND or orthopnea. She denies any palpitations, presyncope or syncope. Since I last saw her she has been started on amlodipine.  Allergies  Allergen Reactions  . Codeine     Current Outpatient Prescriptions  Medication Sig Dispense Refill  . amLODipine (NORVASC) 5 MG tablet       . aspirin 325 MG EC tablet Take 325 mg by mouth daily.        Marland Kitchen atenolol (TENORMIN) 50 MG tablet Take 50mg  tab in the am and a 25mg  tab in the pm  45 tablet  6  . Calcium Carbonate-Vitamin D (CALCIUM + D PO) Take by mouth daily.        . Ginkgo Biloba 60 MG CAPS Take by mouth daily.        Marland Kitchen losartan (COZAAR) 100 MG tablet TAKE 1 TABLET EVERY DAY  90 tablet  2  . Multiple Vitamin (MULTIVITAMIN) tablet Take 1 tablet by mouth daily.        Marland Kitchen nystatin-triamcinolone (MYCOLOG II) cream       . Omega-3 Fatty Acids (FISH OIL) 1200 MG CAPS Take by mouth daily.        . simvastatin (ZOCOR) 40 MG tablet TAKE 1 TABLET AT BEDTIME  90 tablet  0  . vitamin E 400 UNIT capsule Take 400 Units by mouth daily.        Marland Kitchen amoxicillin-clavulanate (AUGMENTIN) 875-125 MG per tablet       . atenolol (TENORMIN) 25 MG tablet Take 50 mg by mouth daily.       . chlorhexidine (PERIDEX) 0.12 % solution       . ibuprofen (ADVIL,MOTRIN) 200 MG tablet Take 200 mg by mouth every 6 (six) hours as needed.        Marland Kitchen spironolactone (ALDACTONE) 25 MG tablet Take 25 mg by mouth daily.        . traMADol (ULTRAM) 50 MG tablet Take 50 mg by mouth every 6 (six) hours as needed.       Marland Kitchen DISCONTD: atenolol (TENORMIN) 25 MG tablet Take 3  tablets (75 mg total) by mouth daily.  90 tablet  3    Past Medical History  Diagnosis Date  . Dysthymic disorder   . HLD (hyperlipidemia)   . Bilateral impacted cerumen   . Osteoarthritis   . HTN (hypertension)   . Allergic rhinitis   . PMS (premenstrual syndrome)   . Hearing loss     Past Surgical History  Procedure Date  . Dilation and curettage of uterus   . Thyroid lobectomy     ROS:  As stated in the HPI and negative for all other systems.  PHYSICAL EXAM BP 141/85  Pulse 65  Resp 18  Ht 5\' 3"  (1.6 m)  Wt 134 lb (60.782 kg)  BMI 23.74 kg/m2 GENERAL:  Well appearing HEENT:  Pupils equal round and reactive, fundi not visualized, oral mucosa unremarkable NECK:  No jugular venous distention, waveform within normal limits, carotid upstroke brisk and symmetric, no bruits, no thyromegaly LYMPHATICS:  No cervical, inguinal adenopathy LUNGS:  Clear to  auscultation bilaterally BACK:  No CVA tenderness CHEST:  Unremarkable HEART:  PMI not displaced or sustained,S1 and S2 within normal limits, no S3, no S4, no clicks, no rubs, no murmurs ABD:  Flat, positive bowel sounds normal in frequency in pitch, no bruits, no rebound, no guarding, no midline pulsatile mass, no hepatomegaly, no splenomegaly EXT:  2 plus pulses throughout, no edema, no cyanosis no clubbing  EKG:  Sinus rhythm, rate 66, axis within normal limits intervals within normal limits, no acute ST-T wave changes. 03/23/2012   ASSESSMENT AND PLAN

## 2012-03-23 NOTE — Assessment & Plan Note (Signed)
The blood pressure is at target. No change in medications is indicated. We will continue with therapeutic lifestyle changes (TLC).  

## 2012-03-23 NOTE — Patient Instructions (Signed)
The current medical regimen is effective;  continue present plan and medications.  Follow up in 1 year with Dr Hochrein.  You will receive a letter in the mail 2 months before you are due.  Please call us when you receive this letter to schedule your follow up appointment.  

## 2012-05-19 DIAGNOSIS — Z1331 Encounter for screening for depression: Secondary | ICD-10-CM | POA: Diagnosis not present

## 2012-05-19 DIAGNOSIS — Z0000 Encounter for general adult medical examination without abnormal findings: Secondary | ICD-10-CM | POA: Diagnosis not present

## 2012-06-01 ENCOUNTER — Other Ambulatory Visit: Payer: Self-pay | Admitting: Cardiology

## 2012-06-01 NOTE — Telephone Encounter (Signed)
..   Requested Prescriptions   Pending Prescriptions Disp Refills  . simvastatin (ZOCOR) 40 MG tablet [Pharmacy Med Name: SIMVASTATIN 40 MG TABLET] 90 tablet 10    Sig: TAKE 1 TABLET AT BEDTIME

## 2012-06-10 DIAGNOSIS — H6120 Impacted cerumen, unspecified ear: Secondary | ICD-10-CM | POA: Diagnosis not present

## 2012-07-06 ENCOUNTER — Other Ambulatory Visit: Payer: Self-pay | Admitting: Cardiology

## 2012-07-07 NOTE — Telephone Encounter (Signed)
..   Requested Prescriptions   Pending Prescriptions Disp Refills  . losartan (COZAAR) 100 MG tablet [Pharmacy Med Name: LOSARTAN POTASSIUM 100 MG TAB] 90 tablet 2    Sig: TAKE 1 TABLET EVERY DAY

## 2012-08-07 DIAGNOSIS — Z23 Encounter for immunization: Secondary | ICD-10-CM | POA: Diagnosis not present

## 2012-09-16 DIAGNOSIS — Z01419 Encounter for gynecological examination (general) (routine) without abnormal findings: Secondary | ICD-10-CM | POA: Diagnosis not present

## 2012-09-16 DIAGNOSIS — Z1151 Encounter for screening for human papillomavirus (HPV): Secondary | ICD-10-CM | POA: Diagnosis not present

## 2012-09-16 DIAGNOSIS — N951 Menopausal and female climacteric states: Secondary | ICD-10-CM | POA: Diagnosis not present

## 2012-09-16 DIAGNOSIS — Z124 Encounter for screening for malignant neoplasm of cervix: Secondary | ICD-10-CM | POA: Diagnosis not present

## 2012-09-16 DIAGNOSIS — N950 Postmenopausal bleeding: Secondary | ICD-10-CM | POA: Diagnosis not present

## 2012-09-24 DIAGNOSIS — N950 Postmenopausal bleeding: Secondary | ICD-10-CM | POA: Diagnosis not present

## 2012-09-24 DIAGNOSIS — D259 Leiomyoma of uterus, unspecified: Secondary | ICD-10-CM | POA: Diagnosis not present

## 2012-11-17 ENCOUNTER — Other Ambulatory Visit: Payer: Self-pay | Admitting: Cardiology

## 2013-01-10 ENCOUNTER — Other Ambulatory Visit: Payer: Self-pay | Admitting: Cardiology

## 2013-03-16 ENCOUNTER — Other Ambulatory Visit: Payer: Self-pay | Admitting: Cardiology

## 2013-03-16 NOTE — Telephone Encounter (Signed)
PT NEED APPOINTMENT. Fax Received. Refill Completed. Terri Wiggins (R.M.A)

## 2013-04-12 ENCOUNTER — Other Ambulatory Visit: Payer: Self-pay | Admitting: Cardiology

## 2013-04-12 NOTE — Telephone Encounter (Signed)
..   Requested Prescriptions   Pending Prescriptions Disp Refills  . losartan (COZAAR) 100 MG tablet [Pharmacy Med Name: LOSARTAN POTASSIUM 100 MG TAB] 30 tablet 1    Sig: TAKE 1 TABLET EVERY DAY

## 2013-05-14 ENCOUNTER — Other Ambulatory Visit: Payer: Self-pay | Admitting: Cardiology

## 2013-05-26 ENCOUNTER — Other Ambulatory Visit: Payer: Self-pay

## 2013-05-26 MED ORDER — LOSARTAN POTASSIUM 100 MG PO TABS: ORAL_TABLET | ORAL | Status: DC

## 2013-05-26 MED ORDER — SPIRONOLACTONE 25 MG PO TABS: ORAL_TABLET | ORAL | Status: DC

## 2013-05-30 ENCOUNTER — Other Ambulatory Visit: Payer: Self-pay | Admitting: Cardiology

## 2013-05-31 ENCOUNTER — Other Ambulatory Visit: Payer: Self-pay | Admitting: Cardiology

## 2013-06-02 ENCOUNTER — Other Ambulatory Visit: Payer: Self-pay | Admitting: Cardiology

## 2013-06-03 MED ORDER — ATENOLOL 50 MG PO TABS: ORAL_TABLET | ORAL | Status: DC

## 2013-06-12 ENCOUNTER — Other Ambulatory Visit: Payer: Self-pay | Admitting: Cardiology

## 2013-07-24 ENCOUNTER — Other Ambulatory Visit: Payer: Self-pay | Admitting: Cardiology

## 2013-08-30 ENCOUNTER — Other Ambulatory Visit: Payer: Self-pay | Admitting: Cardiology

## 2013-09-02 ENCOUNTER — Other Ambulatory Visit: Payer: Self-pay | Admitting: Cardiology

## 2013-09-03 ENCOUNTER — Other Ambulatory Visit: Payer: Self-pay | Admitting: Cardiology

## 2013-10-11 ENCOUNTER — Other Ambulatory Visit: Payer: Self-pay | Admitting: Cardiology

## 2013-10-20 ENCOUNTER — Encounter (INDEPENDENT_AMBULATORY_CARE_PROVIDER_SITE_OTHER): Payer: Self-pay

## 2013-10-20 ENCOUNTER — Encounter: Payer: Self-pay | Admitting: Cardiology

## 2013-10-20 ENCOUNTER — Ambulatory Visit (INDEPENDENT_AMBULATORY_CARE_PROVIDER_SITE_OTHER): Payer: Medicare Other | Admitting: Cardiology

## 2013-10-20 VITALS — BP 133/74 | HR 64 | Ht 60.0 in | Wt 140.0 lb

## 2013-10-20 DIAGNOSIS — I10 Essential (primary) hypertension: Secondary | ICD-10-CM

## 2013-10-20 MED ORDER — LOSARTAN POTASSIUM 100 MG PO TABS: ORAL_TABLET | ORAL | Status: DC

## 2013-10-20 MED ORDER — SPIRONOLACTONE 25 MG PO TABS: 25.0000 mg | ORAL_TABLET | Freq: Every day | ORAL | Status: DC

## 2013-10-20 MED ORDER — SIMVASTATIN 40 MG PO TABS: ORAL_TABLET | ORAL | Status: DC

## 2013-10-20 MED ORDER — AMLODIPINE BESYLATE 5 MG PO TABS: 2.5000 mg | ORAL_TABLET | Freq: Every day | ORAL | Status: DC

## 2013-10-20 MED ORDER — ATENOLOL 50 MG PO TABS: ORAL_TABLET | ORAL | Status: DC

## 2013-10-20 NOTE — Patient Instructions (Signed)
The current medical regimen is effective;  continue present plan and medications.  Follow up in 1 year with Dr Hochrein.  You will receive a letter in the mail 2 months before you are due.  Please call us when you receive this letter to schedule your follow up appointment.  

## 2013-10-20 NOTE — Progress Notes (Signed)
HPI Patient presents for followup of dyspnea hypertension. Since I last saw her she has done relatively well. 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 She's also had a driving up to [place] to help her daughter who had surgery. She denies any chest pressure, neck or arm discomfort. She's not getting any palpitations, presyncope or syncope. She has no PND or orthopnea. She does have some fleeting occasional upper chest discomfort but it's not consistent with angina or reproducible with any activity.  Allergies  Allergen Reactions  . Codeine     Current Outpatient Prescriptions  Medication Sig Dispense Refill  . amLODipine (NORVASC) 2.5 MG tablet       . aspirin 325 MG EC tablet Take 325 mg by mouth daily.        Marland Kitchen atenolol (TENORMIN) 50 MG tablet Take 50mg  tab in the am and a 25mg  tab in the pm  45 tablet  6  . Calcium Carbonate-Vitamin D (CALCIUM + D PO) Take by mouth daily.        . Ginkgo Biloba 60 MG CAPS Take by mouth daily.        Marland Kitchen losartan (COZAAR) 100 MG tablet TAKE 1 TABLET EVERY DAY  90 tablet  2  . Multiple Vitamin (MULTIVITAMIN) tablet Take 1 tablet by mouth daily.        Marland Kitchen nystatin-triamcinolone (MYCOLOG II) cream       . Omega-3 Fatty Acids (FISH OIL) 1200 MG CAPS Take by mouth daily.        . simvastatin (ZOCOR) 40 MG tablet TAKE 1 TABLET AT BEDTIME  90 tablet  0  . vitamin E 400 UNIT capsule Take 400 Units by mouth daily.        Marland Kitchen amoxicillin-clavulanate (AUGMENTIN) 875-125 MG per tablet       . chlorhexidine (PERIDEX) 0.12 % solution       . ibuprofen (ADVIL,MOTRIN) 200 MG tablet Take 200 mg by mouth every 6 (six) hours as needed.        Marland Kitchen spironolactone (ALDACTONE) 25 MG tablet Take 25 mg by mouth daily.        . traMADol (ULTRAM) 50 MG tablet Take 50 mg by mouth every 6 (six) hours as needed.         Past Medical History  Diagnosis Date  . Dysthymic disorder   . HLD (hyperlipidemia)   . Bilateral impacted cerumen   .  Osteoarthritis   . HTN (hypertension)   . Allergic rhinitis   . PMS (premenstrual syndrome)   . Hearing loss     Past Surgical History  Procedure Laterality Date  . Dilation and curettage of uterus    . Thyroid lobectomy      ROS:  As stated in the HPI and negative for all other systems.  PHYSICAL EXAM BP 133/74  Pulse 64  Ht 5' (1.524 m)  Wt 140 lb (63.504 kg)  BMI 27.34 kg/m2 GENERAL:  Well appearing HEENT:  Pupils equal round and reactive, fundi not visualized, oral mucosa unremarkable NECK:  No jugular venous distention, waveform within normal limits, carotid upstroke brisk and symmetric, no bruits, no thyromegaly LYMPHATICS:  No cervical, inguinal adenopathy LUNGS:  Clear to auscultation bilaterally BACK:  No CVA tenderness CHEST:  Unremarkable HEART:  PMI not displaced or sustained,S1 and S2 within normal limits, no S3, no S4, no clicks, no rubs, no murmurs ABD:  Flat, positive bowel sounds normal in frequency in pitch, no  bruits, no rebound, no guarding, no midline pulsatile mass, no hepatomegaly, no splenomegaly EXT:  2 plus pulses throughout, no edema, no cyanosis no clubbing  ASSESSMENT AND PLAN  HTN:  The blood pressure is at target. No change in medications is indicated. We will continue with therapeutic lifestyle changes (TLC).  We discussed breast reduction and in particular I prescribed exercise.

## 2013-12-08 ENCOUNTER — Encounter: Payer: Self-pay | Admitting: Internal Medicine

## 2014-01-04 ENCOUNTER — Other Ambulatory Visit: Payer: Self-pay | Admitting: Cardiology

## 2014-01-05 NOTE — Telephone Encounter (Signed)
Per pharmacy patient is taking amlodipine 5mg  qd, but at the last ov it was sent in take 2.5mg  qd. Please advise. Thanks, MI

## 2014-01-06 ENCOUNTER — Other Ambulatory Visit: Payer: Self-pay | Admitting: *Deleted

## 2014-01-06 MED ORDER — AMLODIPINE BESYLATE 5 MG PO TABS: 5.0000 mg | ORAL_TABLET | Freq: Every day | ORAL | Status: DC

## 2014-01-06 NOTE — Telephone Encounter (Signed)
OK to fill 5 mg QD

## 2014-01-06 NOTE — Telephone Encounter (Signed)
Refill sent in

## 2014-06-15 ENCOUNTER — Other Ambulatory Visit: Payer: Self-pay

## 2014-06-15 MED ORDER — ATENOLOL 50 MG PO TABS: ORAL_TABLET | ORAL | Status: DC

## 2014-07-12 ENCOUNTER — Other Ambulatory Visit: Payer: Self-pay

## 2014-07-12 MED ORDER — AMLODIPINE BESYLATE 5 MG PO TABS: 5.0000 mg | ORAL_TABLET | Freq: Every day | ORAL | Status: DC

## 2014-08-17 ENCOUNTER — Encounter: Payer: Self-pay | Admitting: Internal Medicine

## 2014-09-14 ENCOUNTER — Other Ambulatory Visit: Payer: Self-pay | Admitting: Cardiology

## 2014-10-31 ENCOUNTER — Other Ambulatory Visit: Payer: Self-pay | Admitting: Cardiology

## 2014-10-31 ENCOUNTER — Other Ambulatory Visit: Payer: Self-pay | Admitting: *Deleted

## 2014-10-31 NOTE — Telephone Encounter (Signed)
Rx(s) sent to pharmacy electronically. Called by JC, LPN on 67/73/73 and informed she needed an OV

## 2014-10-31 NOTE — Telephone Encounter (Signed)
Pt. Called and she was going to call us this week to make an appt.

## 2014-10-31 NOTE — Telephone Encounter (Signed)
Already refilled by brittney sigmon

## 2014-11-02 ENCOUNTER — Other Ambulatory Visit: Payer: Self-pay | Admitting: *Deleted

## 2014-11-02 MED ORDER — LOSARTAN POTASSIUM 100 MG PO TABS: ORAL_TABLET | ORAL | Status: DC

## 2014-11-09 ENCOUNTER — Other Ambulatory Visit: Payer: Self-pay | Admitting: Cardiology

## 2014-12-04 ENCOUNTER — Other Ambulatory Visit: Payer: Self-pay | Admitting: Cardiology

## 2014-12-06 ENCOUNTER — Other Ambulatory Visit: Payer: Self-pay | Admitting: *Deleted

## 2014-12-06 ENCOUNTER — Ambulatory Visit (INDEPENDENT_AMBULATORY_CARE_PROVIDER_SITE_OTHER): Payer: Medicare Other | Admitting: Cardiology

## 2014-12-06 ENCOUNTER — Encounter: Payer: Self-pay | Admitting: Cardiology

## 2014-12-06 VITALS — BP 100/70 | HR 65 | Ht 62.0 in | Wt 149.1 lb

## 2014-12-06 DIAGNOSIS — R002 Palpitations: Secondary | ICD-10-CM

## 2014-12-06 DIAGNOSIS — R0989 Other specified symptoms and signs involving the circulatory and respiratory systems: Secondary | ICD-10-CM

## 2014-12-06 NOTE — Patient Instructions (Signed)
Your physician recommends that you schedule a follow-up appointment in: one year with Dr. Hochrein  

## 2014-12-06 NOTE — Progress Notes (Signed)
HPI Patient presents for followup of dyspnea and hypertension. Since I last saw her she has done relatively well.She denies any chest pressure, neck or arm discomfort. She's not getting any palpitations, presyncope or syncope. She has no PND or orthopnea. She does have chronic dyspnea which has been worked up extensively. She thinks this might be getting slowly worse. However, she's not participating in routine physical exercise.  Allergies  Allergen Reactions  . Codeine    Prior to Admission medications   Medication Sig Start Date End Date Taking? Authorizing Provider  amLODipine (NORVASC) 5 MG tablet Take 1 tablet (5 mg total) by mouth daily. 07/12/14  Yes Minus Breeding, MD  aspirin 325 MG EC tablet Take 325 mg by mouth daily.     Yes Historical Provider, MD  atenolol (TENORMIN) 25 MG tablet Take 25 mg by mouth at bedtime.   Yes Historical Provider, MD  atenolol (TENORMIN) 50 MG tablet Take 50 mg by mouth daily after breakfast.   Yes Historical Provider, MD  chlorhexidine (PERIDEX) 0.12 % solution  01/21/12  Yes Historical Provider, MD  Ginkgo Biloba 60 MG CAPS Take by mouth daily.     Yes Historical Provider, MD  ibuprofen (ADVIL,MOTRIN) 200 MG tablet Take 200 mg by mouth every 6 (six) hours as needed.     Yes Historical Provider, MD  losartan (COZAAR) 100 MG tablet TAKE 1 TABLET EVERY DAY 11/02/14  Yes Minus Breeding, MD  Multiple Vitamin (MULTIVITAMIN) tablet Take 1 tablet by mouth daily.     Yes Historical Provider, MD  Omega-3 Fatty Acids (FISH OIL) 1200 MG CAPS Take by mouth daily.     Yes Historical Provider, MD  simvastatin (ZOCOR) 40 MG tablet TAKE 1 TABLET AT BEDTIME 11/09/14  Yes Minus Breeding, MD  vitamin E 400 UNIT capsule Take 400 Units by mouth daily.     Yes Historical Provider, MD      Past Medical History  Diagnosis Date  . Dysthymic disorder   . HLD (hyperlipidemia)   . Bilateral impacted cerumen   . Osteoarthritis   . HTN (hypertension)   . Allergic rhinitis     . PMS (premenstrual syndrome)   . Hearing loss     Past Surgical History  Procedure Laterality Date  . Dilation and curettage of uterus    . Thyroid lobectomy      ROS:  As stated in the HPI and negative for all other systems.  PHYSICAL EXAM BP 100/70 mmHg  Pulse 65  Ht 5\' 2"  (1.575 m)  Wt 149 lb 1.6 oz (67.631 kg)  BMI 27.26 kg/m2 GENERAL:  Well appearing NECK:  No jugular venous distention, waveform within normal limits, carotid upstroke brisk and symmetric, possible soft bruits, no thyromegaly LUNGS:  Clear to auscultation bilaterally CHEST:  Unremarkable HEART:  PMI not displaced or sustained,S1 and S2 within normal limits, no S3, no S4, no clicks, no rubs, no murmurs ABD:  Flat, positive bowel sounds normal in frequency in pitch, no bruits, no rebound, no guarding, no midline pulsatile mass, no hepatomegaly, no splenomegaly EXT:  2 plus pulses throughout, no edema, no cyanosis no clubbing  Sinus rhythm, rate 65, axis within normal limits, intervals within normal limits, no acute ST-T wave changes.  12/06/2014   ASSESSMENT AND PLAN  HTN:  The blood pressure is at target. No change in medications is indicated. We will continue with therapeutic lifestyle changes (TLC).  We discussed exercise.    BRUITS:  I will check a  Doppler.

## 2014-12-07 NOTE — Telephone Encounter (Signed)
Rx(s) sent to pharmacy electronically.  

## 2014-12-08 ENCOUNTER — Other Ambulatory Visit: Payer: Self-pay

## 2014-12-08 MED ORDER — SIMVASTATIN 40 MG PO TABS: 40.0000 mg | ORAL_TABLET | Freq: Every day | ORAL | Status: DC

## 2014-12-08 NOTE — Telephone Encounter (Signed)
rx sent to pharmacy

## 2014-12-10 ENCOUNTER — Other Ambulatory Visit: Payer: Self-pay

## 2014-12-10 MED ORDER — AMLODIPINE BESYLATE 5 MG PO TABS: ORAL_TABLET | ORAL | Status: DC

## 2014-12-12 ENCOUNTER — Telehealth (HOSPITAL_COMMUNITY): Payer: Self-pay | Admitting: *Deleted

## 2014-12-13 ENCOUNTER — Telehealth (HOSPITAL_COMMUNITY): Payer: Self-pay | Admitting: *Deleted

## 2014-12-19 ENCOUNTER — Ambulatory Visit (HOSPITAL_COMMUNITY)
Admission: RE | Admit: 2014-12-19 | Discharge: 2014-12-19 | Disposition: A | Discharge status: Home / Self Care | Payer: Medicare Other | Source: Ambulatory Visit | Attending: Cardiology | Admitting: Cardiology

## 2014-12-19 DIAGNOSIS — R0989 Other specified symptoms and signs involving the circulatory and respiratory systems: Secondary | ICD-10-CM

## 2014-12-19 NOTE — Progress Notes (Signed)
Carotid Duplex Completed. °Brianna L Mazza,RVT °

## 2015-03-08 ENCOUNTER — Other Ambulatory Visit: Payer: Self-pay | Admitting: Cardiology

## 2015-03-08 ENCOUNTER — Other Ambulatory Visit: Payer: Self-pay | Admitting: *Deleted

## 2015-03-08 MED ORDER — ATENOLOL 25 MG PO TABS: 25.0000 mg | ORAL_TABLET | Freq: Every day | ORAL | Status: DC

## 2015-03-08 MED ORDER — ATENOLOL 50 MG PO TABS: 50.0000 mg | ORAL_TABLET | Freq: Every day | ORAL | Status: DC

## 2015-03-08 NOTE — Telephone Encounter (Signed)
Atenolol refilled. 

## 2015-05-31 ENCOUNTER — Other Ambulatory Visit: Payer: Self-pay | Admitting: Geriatric Medicine

## 2015-05-31 ENCOUNTER — Ambulatory Visit
Admission: RE | Admit: 2015-05-31 | Discharge: 2015-05-31 | Disposition: A | Discharge status: Home / Self Care | Payer: Medicare Other | Source: Ambulatory Visit | Attending: Geriatric Medicine | Admitting: Geriatric Medicine

## 2015-05-31 DIAGNOSIS — R0600 Dyspnea, unspecified: Secondary | ICD-10-CM

## 2015-06-13 ENCOUNTER — Other Ambulatory Visit: Payer: Self-pay

## 2015-06-13 ENCOUNTER — Ambulatory Visit (HOSPITAL_COMMUNITY): Payer: Medicare Other | Attending: Cardiology

## 2015-06-13 ENCOUNTER — Other Ambulatory Visit: Payer: Self-pay | Admitting: Geriatric Medicine

## 2015-06-13 DIAGNOSIS — I351 Nonrheumatic aortic (valve) insufficiency: Secondary | ICD-10-CM | POA: Insufficient documentation

## 2015-06-13 DIAGNOSIS — R0600 Dyspnea, unspecified: Secondary | ICD-10-CM

## 2015-06-13 DIAGNOSIS — E785 Hyperlipidemia, unspecified: Secondary | ICD-10-CM | POA: Insufficient documentation

## 2015-06-13 DIAGNOSIS — I10 Essential (primary) hypertension: Secondary | ICD-10-CM | POA: Diagnosis not present

## 2015-08-03 ENCOUNTER — Other Ambulatory Visit: Payer: Self-pay

## 2015-08-03 MED ORDER — ATENOLOL 50 MG PO TABS: 75.0000 mg | ORAL_TABLET | Freq: Every day | ORAL | Status: DC

## 2015-10-19 ENCOUNTER — Other Ambulatory Visit: Payer: Self-pay | Admitting: Cardiology

## 2015-10-19 NOTE — Telephone Encounter (Signed)
Rx(s) sent to pharmacy electronically.  

## 2015-11-22 DIAGNOSIS — H578 Other specified disorders of eye and adnexa: Secondary | ICD-10-CM | POA: Diagnosis not present

## 2015-11-22 DIAGNOSIS — Z23 Encounter for immunization: Secondary | ICD-10-CM | POA: Diagnosis not present

## 2015-11-27 ENCOUNTER — Encounter: Payer: Self-pay | Admitting: Gastroenterology

## 2015-11-27 DIAGNOSIS — Z1239 Encounter for other screening for malignant neoplasm of breast: Secondary | ICD-10-CM | POA: Diagnosis not present

## 2015-11-27 DIAGNOSIS — Z780 Asymptomatic menopausal state: Secondary | ICD-10-CM | POA: Diagnosis not present

## 2015-11-27 DIAGNOSIS — Z1211 Encounter for screening for malignant neoplasm of colon: Secondary | ICD-10-CM | POA: Diagnosis not present

## 2015-11-27 DIAGNOSIS — I10 Essential (primary) hypertension: Secondary | ICD-10-CM | POA: Diagnosis not present

## 2015-12-05 DIAGNOSIS — Z803 Family history of malignant neoplasm of breast: Secondary | ICD-10-CM | POA: Diagnosis not present

## 2015-12-05 DIAGNOSIS — M8589 Other specified disorders of bone density and structure, multiple sites: Secondary | ICD-10-CM | POA: Diagnosis not present

## 2015-12-05 DIAGNOSIS — Z1231 Encounter for screening mammogram for malignant neoplasm of breast: Secondary | ICD-10-CM | POA: Diagnosis not present

## 2015-12-05 DIAGNOSIS — Z780 Asymptomatic menopausal state: Secondary | ICD-10-CM | POA: Diagnosis not present

## 2016-01-08 ENCOUNTER — Ambulatory Visit (AMBULATORY_SURGERY_CENTER): Payer: Self-pay | Admitting: *Deleted

## 2016-01-08 VITALS — Ht 61.0 in | Wt 148.8 lb

## 2016-01-08 DIAGNOSIS — Z1211 Encounter for screening for malignant neoplasm of colon: Secondary | ICD-10-CM

## 2016-01-08 NOTE — Progress Notes (Signed)
Patient denies any allergies to egg or soy products. Patient denies complications with anesthesia/sedation.  Patient denies oxygen use at home and denies diet medications. Emmi instructions for colonoscopy  explained but denied.   

## 2016-01-09 ENCOUNTER — Encounter: Payer: Self-pay | Admitting: Internal Medicine

## 2016-01-10 NOTE — Progress Notes (Signed)
HPI Patient presents for followup of dyspnea and hypertension. Since I last saw her she has done OK.  She denies any chest pressure, neck or arm discomfort. She's not getting any palpitations, presyncope or syncope. She has no PND or orthopnea. She does have chronic dyspnea which has been slowly progressive.  She will get SOB climbing flight of stairs.  She has to stop at the top of the stairs.    Allergies  Allergen Reactions  . Codeine Nausea And Vomiting   Prior to Admission medications   Medication Sig Start Date End Date Taking? Authorizing Provider  amLODipine (NORVASC) 5 MG tablet TAKE 1 TABLET (5 MG TOTAL) BY MOUTH DAILY. 12/10/14  Yes Minus Breeding, MD  aspirin 325 MG EC tablet Take 325 mg by mouth daily.     Yes Historical Provider, MD  atenolol (TENORMIN) 25 MG tablet Take 1 tablet (25 mg total) by mouth at bedtime. 03/08/15  Yes Minus Breeding, MD  atenolol (TENORMIN) 50 MG tablet Take 50 mg by mouth daily. Take 1 tab by mouth daily in the morning   Yes Historical Provider, MD  bisacodyl (DULCOLAX) 5 MG EC tablet Take 5 mg by mouth daily as needed for moderate constipation. Dulcolax 5 mg tab take as directed for colonoscopy prep.   Yes Historical Provider, MD  Calcium Carb-Cholecalciferol (CALCIUM 1000 + D PO) Take 1 capsule by mouth daily.   Yes Historical Provider, MD  chlorhexidine (PERIDEX) 0.12 % solution Reported on 01/08/2016 01/21/12  Yes Historical Provider, MD  Ginkgo Biloba 60 MG CAPS Take by mouth daily.     Yes Historical Provider, MD  losartan (COZAAR) 100 MG tablet TAKE 1 TABLET EVERY DAY 10/19/15  Yes Minus Breeding, MD  Multiple Vitamin (MULTIVITAMIN) tablet Take 1 tablet by mouth daily.     Yes Historical Provider, MD  Omega-3 Fatty Acids (FISH OIL) 1200 MG CAPS Take by mouth daily.     Yes Historical Provider, MD  polyethylene glycol powder (GLYCOLAX/MIRALAX) powder Take 1 Container by mouth once. Miralax 238 grams as directed for colonoscopy prep.   Yes Historical  Provider, MD  simvastatin (ZOCOR) 40 MG tablet Take 1 tablet (40 mg total) by mouth at bedtime. 12/08/14  Yes Minus Breeding, MD  vitamin E 400 UNIT capsule Take 400 Units by mouth daily.     Yes Historical Provider, MD  ibuprofen (ADVIL,MOTRIN) 200 MG tablet Take 200 mg by mouth every 6 (six) hours as needed. Reported on 01/08/2016    Historical Provider, MD     Past Medical History  Diagnosis Date  . Dysthymic disorder   . HLD (hyperlipidemia)   . Bilateral impacted cerumen   . HTN (hypertension)   . Allergic rhinitis   . PMS (premenstrual syndrome)   . Hearing loss     bilateral - wears hearing aids  . SOB (shortness of breath) on exertion   . Cataract 2012    surgery to remove bilateral cataracts  . Osteoarthritis     hands, knees  . Osteoarthritis   . Wears partial dentures     upper and lower    Past Surgical History  Procedure Laterality Date  . Dilation and curettage of uterus    . Thyroid lobectomy    . Tonsillectomy    . Wisdom tooth extraction    . Cataract surgery  2012    bilateral   . Colonoscopy      [facility]     ROS:  As stated in the  HPI and negative for all other systems.  PHYSICAL EXAM BP 138/82 mmHg  Pulse 62  Ht 5\' 2"  (1.575 m)  Wt 149 lb 6 oz (67.756 kg)  BMI 27.31 kg/m2 GENERAL:  Well appearing NECK:  No jugular venous distention, waveform within normal limits, carotid upstroke brisk and symmetric, possible soft bruits, no thyromegaly LUNGS:  Clear to auscultation bilaterally CHEST:  Unremarkable HEART:  PMI not displaced or sustained,S1 and S2 within normal limits, no S3, no S4, no clicks, no rubs, no murmurs ABD:  Flat, positive bowel sounds normal in frequency in pitch, no bruits, no rebound, no guarding, no midline pulsatile mass, no hepatomegaly, no splenomegaly EXT:  2 plus pulses throughout, no edema, no cyanosis no clubbing  EKG:  Sinus rhythm, rate 62, axis within normal limits, intervals within normal limits, no acute ST-T wave  changes. PACs    01/11/2016   ASSESSMENT AND PLAN  HTN:  The blood pressure is at target. No change in medications is indicated. We will continue with therapeutic lifestyle changes (TLC).  We discussed exercise again.    BRUITS:   She did not have significant obstruction.  No change in therapy is indicated.    SOB:  Given the progression in her dyspnea I will bring her back for a POET (Plain Old Exercise Treadmill).  However, this might be related to deconditioning as she doesn't have a chance to exercise much and her husband is requiring her care.

## 2016-01-11 ENCOUNTER — Encounter: Payer: Self-pay | Admitting: Cardiology

## 2016-01-11 ENCOUNTER — Ambulatory Visit (INDEPENDENT_AMBULATORY_CARE_PROVIDER_SITE_OTHER): Payer: Medicare Other | Admitting: Cardiology

## 2016-01-11 VITALS — BP 138/82 | HR 62 | Ht 62.0 in | Wt 149.4 lb

## 2016-01-11 DIAGNOSIS — R0602 Shortness of breath: Secondary | ICD-10-CM | POA: Diagnosis not present

## 2016-01-11 NOTE — Patient Instructions (Signed)
Dr Hochrein recommends that you continue on your current medications as directed. Please refer to the Current Medication list given to you today.  Your physician has requested that you have an exercise tolerance test. For further information please visit [email or web address]. Please also follow instruction sheet, as given.  Dr Hochrein recommends that you schedule a follow-up appointment in 1 year. You will receive a reminder letter in the mail two months in advance. If you don't receive a letter, please call our office to schedule the follow-up appointment.  If you need a refill on your cardiac medications before your next appointment, please call your pharmacy. 

## 2016-01-15 ENCOUNTER — Encounter: Payer: Self-pay | Admitting: Gastroenterology

## 2016-01-23 ENCOUNTER — Ambulatory Visit (AMBULATORY_SURGERY_CENTER): Payer: Medicare Other | Admitting: Gastroenterology

## 2016-01-23 ENCOUNTER — Encounter: Payer: Self-pay | Admitting: Gastroenterology

## 2016-01-23 VITALS — BP 128/75 | HR 67 | Temp 98.6°F | Resp 18 | Ht 61.0 in | Wt 148.0 lb

## 2016-01-23 DIAGNOSIS — Z1211 Encounter for screening for malignant neoplasm of colon: Secondary | ICD-10-CM

## 2016-01-23 DIAGNOSIS — I10 Essential (primary) hypertension: Secondary | ICD-10-CM | POA: Diagnosis not present

## 2016-01-23 MED ORDER — SODIUM CHLORIDE 0.9 % IV SOLN: 500.0000 mL | INTRAVENOUS | Status: DC

## 2016-01-23 NOTE — Progress Notes (Signed)
Patient awakening,vss,report to rn 

## 2016-01-23 NOTE — Patient Instructions (Signed)

## 2016-01-24 ENCOUNTER — Telehealth: Payer: Self-pay | Admitting: *Deleted

## 2016-01-24 NOTE — Telephone Encounter (Signed)
  Follow up Call-  Call back number 01/23/2016  Post procedure Call Back phone  # [phone]  Permission to leave phone message Yes     Patient questions:  Phone rang and then hung up. No message left.

## 2016-01-24 NOTE — Op Note (Signed)
[facility] Patient Name: Terri Wiggins Procedure Date: 01/23/2016 10:58 AM MRN: [id number] Endoscopist: Remo Lipps P. Havery Moros , MD Age: 82 Referring MD:  Date of Birth: 1934/01/03 Gender: Female Procedure:                Colonoscopy Indications:              Screening for colorectal malignant neoplasm Medicines:                Monitored Anesthesia Care Procedure:                Pre-Anesthesia Assessment:                           - Prior to the procedure, a History and Physical                            was performed, and patient medications and                            allergies were reviewed. The patient's tolerance of                            previous anesthesia was also reviewed. The risks                            and benefits of the procedure and the sedation                            options and risks were discussed with the patient.                            All questions were answered, and informed consent                            was obtained. Prior Anticoagulants: The patient has                            taken aspirin, last dose was 1 day prior to                            procedure. ASA Grade Assessment: II - A patient                            with mild systemic disease. After reviewing the                            risks and benefits, the patient was deemed in                            satisfactory condition to undergo the procedure.                           After obtaining informed consent, the colonoscope  was passed under direct vision. Throughout the                            procedure, the patient's blood pressure, pulse, and                            oxygen saturations were monitored continuously. The                            Model PCF-H190L [id number]) scope was introduced                            through the anus and advanced to the the cecum,                            identified by appendiceal orifice  and ileocecal                            valve. The colonoscopy was performed without                            difficulty. The patient tolerated the procedure                            well. The quality of the bowel preparation was                            good. The ileocecal valve, appendiceal orifice, and                            rectum were photographed. Scope In: 11:07:19 AM Scope Out: 11:24:25 AM Scope Withdrawal Time: 0 hours 11 minutes 49 seconds  Total Procedure Duration: 0 hours 17 minutes 6 seconds  Findings:      The perianal and digital rectal examinations were normal.      Many medium-mouthed diverticula were found in the sigmoid colon,       descending colon, transverse colon, ascending colon and cecum.      Non-bleeding internal hemorrhoids were found during retroflexion. The       hemorrhoids were small.      The exam was otherwise without abnormality. Complications:            No immediate complications. Estimated blood loss:                            None. Estimated Blood Loss:     Estimated blood loss: none. Impression:               - Diverticulosis in the sigmoid colon, in the                            descending colon, in the transverse colon, in the                            ascending colon and in the cecum.                           -  Non-bleeding internal hemorrhoids.                           - The examination was otherwise normal.                           - No specimens collected. Recommendation:           - Patient has a contact number available for                            emergencies. The signs and symptoms of potential                            delayed complications were discussed with the                            patient. Return to normal activities tomorrow.                            Written discharge instructions were provided to the                            patient.                           - Resume previous diet.                            - Continue present medications.                           - No further colon cancer screening is warranted. Procedure Code(s):        --- Professional ---                           [id number], Colorectal cancer screening; colonoscopy on                            individual not meeting criteria for high risk CPT copyright 2016 American Medical Association. All rights reserved. Remo Lipps P. Havery Moros, MD [name], MD 01/23/2016 11:28:21 AM This report has been signed electronically. Number of Addenda: 0 Referring MD:      Cristine Polio

## 2016-01-29 ENCOUNTER — Telehealth (HOSPITAL_COMMUNITY): Payer: Self-pay

## 2016-01-29 NOTE — Telephone Encounter (Signed)
Encounter complete. 

## 2016-01-31 ENCOUNTER — Ambulatory Visit (HOSPITAL_COMMUNITY)
Admission: RE | Admit: 2016-01-31 | Discharge: 2016-01-31 | Disposition: A | Discharge status: Home / Self Care | Payer: Medicare Other | Source: Ambulatory Visit | Attending: Cardiology | Admitting: Cardiology

## 2016-01-31 DIAGNOSIS — R0602 Shortness of breath: Secondary | ICD-10-CM | POA: Insufficient documentation

## 2016-01-31 LAB — EXERCISE TOLERANCE TEST
CHL CUP MPHR: 141 {beats}/min
CSEPEDS: 12 s
CSEPEW: 7 METS
Exercise duration (min): 5 min
Peak HR: 142 {beats}/min
Percent HR: 100 %
RPE: 16
Rest HR: 77 {beats}/min

## 2016-02-11 ENCOUNTER — Other Ambulatory Visit: Payer: Self-pay | Admitting: Cardiology

## 2016-02-11 NOTE — Telephone Encounter (Signed)
Rx request sent to pharmacy.  

## 2016-03-09 ENCOUNTER — Other Ambulatory Visit: Payer: Self-pay | Admitting: Cardiology

## 2016-03-10 NOTE — Telephone Encounter (Signed)
Rx request sent to pharmacy.  

## 2016-04-19 ENCOUNTER — Other Ambulatory Visit: Payer: Self-pay | Admitting: Cardiology

## 2016-06-27 DIAGNOSIS — Z0000 Encounter for general adult medical examination without abnormal findings: Secondary | ICD-10-CM | POA: Diagnosis not present

## 2016-06-27 DIAGNOSIS — Z1389 Encounter for screening for other disorder: Secondary | ICD-10-CM | POA: Diagnosis not present

## 2016-06-27 DIAGNOSIS — E7800 Pure hypercholesterolemia, unspecified: Secondary | ICD-10-CM | POA: Diagnosis not present

## 2016-06-27 DIAGNOSIS — I10 Essential (primary) hypertension: Secondary | ICD-10-CM | POA: Diagnosis not present

## 2016-06-27 DIAGNOSIS — R0609 Other forms of dyspnea: Secondary | ICD-10-CM | POA: Diagnosis not present

## 2016-06-27 DIAGNOSIS — Z79899 Other long term (current) drug therapy: Secondary | ICD-10-CM | POA: Diagnosis not present

## 2016-06-27 DIAGNOSIS — L821 Other seborrheic keratosis: Secondary | ICD-10-CM | POA: Diagnosis not present

## 2016-08-08 DIAGNOSIS — Z23 Encounter for immunization: Secondary | ICD-10-CM | POA: Diagnosis not present

## 2016-09-11 DIAGNOSIS — H524 Presbyopia: Secondary | ICD-10-CM | POA: Diagnosis not present

## 2016-09-11 DIAGNOSIS — Z961 Presence of intraocular lens: Secondary | ICD-10-CM | POA: Diagnosis not present

## 2016-10-01 DIAGNOSIS — I10 Essential (primary) hypertension: Secondary | ICD-10-CM | POA: Diagnosis not present

## 2016-10-01 DIAGNOSIS — H6123 Impacted cerumen, bilateral: Secondary | ICD-10-CM | POA: Diagnosis not present

## 2016-10-08 ENCOUNTER — Other Ambulatory Visit: Payer: Self-pay | Admitting: Cardiology

## 2016-10-20 DIAGNOSIS — H9193 Unspecified hearing loss, bilateral: Secondary | ICD-10-CM | POA: Diagnosis not present

## 2016-10-20 DIAGNOSIS — Z974 Presence of external hearing-aid: Secondary | ICD-10-CM | POA: Diagnosis not present

## 2016-10-20 DIAGNOSIS — Z87891 Personal history of nicotine dependence: Secondary | ICD-10-CM | POA: Diagnosis not present

## 2016-10-20 DIAGNOSIS — H6123 Impacted cerumen, bilateral: Secondary | ICD-10-CM | POA: Diagnosis not present

## 2016-10-26 ENCOUNTER — Other Ambulatory Visit: Payer: Self-pay | Admitting: Cardiology

## 2016-10-28 NOTE — Telephone Encounter (Signed)
REFILL 

## 2016-12-30 DIAGNOSIS — Z79899 Other long term (current) drug therapy: Secondary | ICD-10-CM | POA: Diagnosis not present

## 2016-12-30 DIAGNOSIS — I10 Essential (primary) hypertension: Secondary | ICD-10-CM | POA: Diagnosis not present

## 2017-01-07 DIAGNOSIS — Z803 Family history of malignant neoplasm of breast: Secondary | ICD-10-CM | POA: Diagnosis not present

## 2017-01-07 DIAGNOSIS — Z1231 Encounter for screening mammogram for malignant neoplasm of breast: Secondary | ICD-10-CM | POA: Diagnosis not present

## 2017-02-16 ENCOUNTER — Other Ambulatory Visit: Payer: Self-pay | Admitting: Cardiology

## 2017-03-14 ENCOUNTER — Other Ambulatory Visit: Payer: Self-pay | Admitting: Cardiology

## 2017-03-16 NOTE — Telephone Encounter (Signed)
Rx(s) sent to pharmacy electronically.  

## 2017-03-19 ENCOUNTER — Other Ambulatory Visit: Payer: Self-pay

## 2017-03-19 MED ORDER — SIMVASTATIN 40 MG PO TABS
40.0000 mg | ORAL_TABLET | Freq: Every day | ORAL | 0 refills | Status: DC
Start: 2017-03-19 — End: 2017-04-16

## 2017-04-16 ENCOUNTER — Other Ambulatory Visit: Payer: Self-pay | Admitting: *Deleted

## 2017-04-16 ENCOUNTER — Other Ambulatory Visit: Payer: Self-pay | Admitting: Cardiology

## 2017-04-16 MED ORDER — SIMVASTATIN 40 MG PO TABS: 40.0000 mg | ORAL_TABLET | Freq: Every day | ORAL | 0 refills | Status: DC

## 2017-04-20 ENCOUNTER — Other Ambulatory Visit: Payer: Self-pay

## 2017-04-20 MED ORDER — SIMVASTATIN 40 MG PO TABS: 40.0000 mg | ORAL_TABLET | Freq: Every day | ORAL | 0 refills | Status: DC

## 2017-04-20 MED ORDER — AMLODIPINE BESYLATE 5 MG PO TABS: 5.0000 mg | ORAL_TABLET | Freq: Every day | ORAL | 0 refills | Status: DC

## 2017-04-20 NOTE — Telephone Encounter (Signed)
Rx(s) sent to pharmacy electronically.  

## 2017-04-20 NOTE — Addendum Note (Signed)
Addended by: Diana Eves on: 04/20/2017 04:08 PM   Modules accepted: Orders

## 2017-05-05 ENCOUNTER — Ambulatory Visit (INDEPENDENT_AMBULATORY_CARE_PROVIDER_SITE_OTHER): Payer: PPO | Admitting: Cardiology

## 2017-05-05 VITALS — BP 124/80 | HR 72 | Ht 61.0 in | Wt 150.0 lb

## 2017-05-05 DIAGNOSIS — I10 Essential (primary) hypertension: Secondary | ICD-10-CM

## 2017-05-05 DIAGNOSIS — R0602 Shortness of breath: Secondary | ICD-10-CM | POA: Diagnosis not present

## 2017-05-05 MED ORDER — ATENOLOL 50 MG PO TABS: 50.0000 mg | ORAL_TABLET | Freq: Every day | ORAL | 3 refills | Status: DC

## 2017-05-05 MED ORDER — ATENOLOL 25 MG PO TABS: 25.0000 mg | ORAL_TABLET | Freq: Every day | ORAL | 3 refills | Status: DC

## 2017-05-05 NOTE — Patient Instructions (Signed)
Dr Percival Spanish recommends that you continue on your current medications as directed. Please refer to the Current Medication list given to you today.  Your physician recommends that you schedule a nurse visit appointment at your earliest convenience for a blood pressure check. Please bring your personal blood pressure monitor/cuff to this appointment.  Dr Percival Spanish recommends that you schedule a follow-up appointment in 12 months. You will receive a reminder letter in the mail two months in advance. If you don't receive a letter, please call our office to schedule the follow-up appointment.  If you need a refill on your cardiac medications before your next appointment, please call your pharmacy.

## 2017-05-05 NOTE — Progress Notes (Signed)
HPI Patient presents for followup of dyspnea and hypertension.  Last year she had some increased dyspnea.  A POET (Plain Old Exercise Treadmill) was negative for evidence of ischemia.  She still gets mildly SOB after two flights of stairs.  However, she does very well and takes care of her husband and does all of her yard work including a Psychologist, counselling!  The patient denies any new symptoms such as chest discomfort, neck or arm discomfort. There has been no new shortness of breath, PND or orthopnea. There have been no reported palpitations, presyncope or syncope.   Allergies  Allergen Reactions  . Codeine Nausea And Vomiting   Prior to Admission medications   Medication Sig Start Date End Date Taking? Authorizing Provider  amLODipine (NORVASC) 5 MG tablet TAKE 1 TABLET (5 MG TOTAL) BY MOUTH DAILY. 12/10/14  Yes Minus Breeding, MD  aspirin 325 MG EC tablet Take 325 mg by mouth daily.     Yes Historical Provider, MD  atenolol (TENORMIN) 25 MG tablet Take 1 tablet (25 mg total) by mouth at bedtime. 03/08/15  Yes Minus Breeding, MD  atenolol (TENORMIN) 50 MG tablet Take 50 mg by mouth daily. Take 1 tab by mouth daily in the morning   Yes Historical Provider, MD  bisacodyl (DULCOLAX) 5 MG EC tablet Take 5 mg by mouth daily as needed for moderate constipation. Dulcolax 5 mg tab take as directed for colonoscopy prep.   Yes Historical Provider, MD  Calcium Carb-Cholecalciferol (CALCIUM 1000 + D PO) Take 1 capsule by mouth daily.   Yes Historical Provider, MD  chlorhexidine (PERIDEX) 0.12 % solution Reported on 01/08/2016 01/21/12  Yes Historical Provider, MD  Ginkgo Biloba 60 MG CAPS Take by mouth daily.     Yes Historical Provider, MD  losartan (COZAAR) 100 MG tablet TAKE 1 TABLET EVERY DAY 10/19/15  Yes Minus Breeding, MD  Multiple Vitamin (MULTIVITAMIN) tablet Take 1 tablet by mouth daily.     Yes Historical Provider, MD  Omega-3 Fatty Acids (FISH OIL) 1200 MG CAPS Take by mouth daily.      Yes Historical Provider, MD  polyethylene glycol powder (GLYCOLAX/MIRALAX) powder Take 1 Container by mouth once. Miralax 238 grams as directed for colonoscopy prep.   Yes Historical Provider, MD  simvastatin (ZOCOR) 40 MG tablet Take 1 tablet (40 mg total) by mouth at bedtime. 12/08/14  Yes Minus Breeding, MD  vitamin E 400 UNIT capsule Take 400 Units by mouth daily.     Yes Historical Provider, MD  ibuprofen (ADVIL,MOTRIN) 200 MG tablet Take 200 mg by mouth every 6 (six) hours as needed. Reported on 01/08/2016    Historical Provider, MD     Past Medical History:  Diagnosis Date  . Allergic rhinitis   . Bilateral impacted cerumen   . Cataract 2012   surgery to remove bilateral cataracts  . Dysthymic disorder   . Hearing loss    bilateral - wears hearing aids  . HLD (hyperlipidemia)   . HTN (hypertension)   . Osteoarthritis    hands, knees  . Osteoarthritis   . PMS (premenstrual syndrome)   . SOB (shortness of breath) on exertion   . Wears partial dentures    upper and lower    Past Surgical History:  Procedure Laterality Date  . cataract surgery  2012   bilateral   . COLONOSCOPY     [facility]   . DILATION AND CURETTAGE OF UTERUS    . THYROID LOBECTOMY    .  TONSILLECTOMY    . WISDOM TOOTH EXTRACTION      ROS:   As stated in the HPI and negative for all other systems.  PHYSICAL EXAM BP 124/80   Pulse 72   Ht 5\' 1"  (1.549 m)   Wt 150 lb (68 kg)   BMI 28.34 kg/m   GENERAL:  Well appearing NECK:  No jugular venous distention, waveform within normal limits, carotid upstroke brisk and symmetric, no bruits, no thyromegaly LUNGS:  Clear to auscultation bilaterally CHEST:  Unremarkable HEART:  PMI not displaced or sustained,S1 and S2 within normal limits, no S3, no S4, no clicks, no rubs, no murmurs ABD:  Flat, positive bowel sounds normal in frequency in pitch, no bruits, no rebound, no guarding, no midline pulsatile mass, no hepatomegaly, no splenomegaly EXT:  2 plus pulses  throughout, no edema, no cyanosis no clubbing   EKG:  Sinus rhythm, rate 72, axis within normal limits, intervals within normal limits, no acute ST-T wave changes. Premature ectopic complexes    05/06/2017   ASSESSMENT AND PLAN  HTN:  The blood pressure is at target. No change in medications is indicated. We will continue with therapeutic lifestyle changes (TLC).  BRUITS:   She had mild plaque in 2016.  No change in therapy is indicated. No further imaging at this time.   SOB:   She had a negative POET (Plain Old Exercise Treadmill) last year.  She has no new symptoms.  No change in therapy.   No further testing.

## 2017-05-06 ENCOUNTER — Encounter: Payer: Self-pay | Admitting: Cardiology

## 2017-05-14 ENCOUNTER — Ambulatory Visit (INDEPENDENT_AMBULATORY_CARE_PROVIDER_SITE_OTHER): Payer: PPO | Admitting: *Deleted

## 2017-05-14 ENCOUNTER — Encounter: Payer: Self-pay | Admitting: *Deleted

## 2017-05-14 VITALS — BP 112/68 | HR 52 | Ht 61.0 in | Wt 149.6 lb

## 2017-05-14 DIAGNOSIS — I10 Essential (primary) hypertension: Secondary | ICD-10-CM

## 2017-05-14 NOTE — Progress Notes (Signed)
Patient presents for BP check per MD She states she is here to have her home BP cuff (Reli-On) calibrated Patient has no complaints Medications & allergies reviewed  Patient brought in list of home BP readings, recorded below. She states she checks her BP randomly. Her home cuff is 44 + years old  5/26  AM 135/75 HR 63          PM 106/66  HR 72 5/29  AM 132/83  HR 65          PM 137/79  HR 73 5/30  AM 124/89  HR 62          PM 129/79  HR 78 6/6    141/89  HR 61 6/13  143/86  HR 59 6/14  130/83  HR 58 6/20  139/93  HR 62  Home BP cuff checked and was off about 82-06 pts systolic & diastolic Recommended to patient that she purchase a new home BP cuff, should she choose Recommends [employer] brand, arm cuff preferred over wrist cuff  Routed to MD as Juluis Rainier

## 2017-05-14 NOTE — Patient Instructions (Signed)
[facility] - arm blood pressure

## 2017-05-14 NOTE — Progress Notes (Signed)
BPs OK.  No change in therapy.

## 2017-05-22 ENCOUNTER — Other Ambulatory Visit: Payer: Self-pay | Admitting: Cardiology

## 2017-05-22 MED ORDER — SIMVASTATIN 40 MG PO TABS: ORAL_TABLET | ORAL | 3 refills | Status: DC

## 2017-05-22 MED ORDER — AMLODIPINE BESYLATE 5 MG PO TABS: 5.0000 mg | ORAL_TABLET | Freq: Every day | ORAL | 3 refills | Status: DC

## 2017-06-25 DIAGNOSIS — H903 Sensorineural hearing loss, bilateral: Secondary | ICD-10-CM | POA: Diagnosis not present

## 2017-07-10 DIAGNOSIS — Z1389 Encounter for screening for other disorder: Secondary | ICD-10-CM | POA: Diagnosis not present

## 2017-07-10 DIAGNOSIS — H6123 Impacted cerumen, bilateral: Secondary | ICD-10-CM | POA: Diagnosis not present

## 2017-07-10 DIAGNOSIS — Z0000 Encounter for general adult medical examination without abnormal findings: Secondary | ICD-10-CM | POA: Diagnosis not present

## 2017-07-10 DIAGNOSIS — E7800 Pure hypercholesterolemia, unspecified: Secondary | ICD-10-CM | POA: Diagnosis not present

## 2017-07-10 DIAGNOSIS — Z79899 Other long term (current) drug therapy: Secondary | ICD-10-CM | POA: Diagnosis not present

## 2017-07-10 DIAGNOSIS — I10 Essential (primary) hypertension: Secondary | ICD-10-CM | POA: Diagnosis not present

## 2017-09-21 DIAGNOSIS — Z961 Presence of intraocular lens: Secondary | ICD-10-CM | POA: Diagnosis not present

## 2017-10-06 ENCOUNTER — Other Ambulatory Visit: Payer: Self-pay | Admitting: *Deleted

## 2017-10-06 MED ORDER — ATENOLOL 50 MG PO TABS: 50.0000 mg | ORAL_TABLET | Freq: Every morning | ORAL | 3 refills | Status: DC

## 2017-11-16 ENCOUNTER — Other Ambulatory Visit: Payer: Self-pay | Admitting: Cardiology

## 2017-12-10 DIAGNOSIS — I10 Essential (primary) hypertension: Secondary | ICD-10-CM | POA: Diagnosis not present

## 2017-12-10 DIAGNOSIS — G3184 Mild cognitive impairment, so stated: Secondary | ICD-10-CM | POA: Diagnosis not present

## 2017-12-24 DIAGNOSIS — H26493 Other secondary cataract, bilateral: Secondary | ICD-10-CM | POA: Diagnosis not present

## 2017-12-24 DIAGNOSIS — H538 Other visual disturbances: Secondary | ICD-10-CM | POA: Diagnosis not present

## 2017-12-24 DIAGNOSIS — Z961 Presence of intraocular lens: Secondary | ICD-10-CM | POA: Diagnosis not present

## 2018-01-06 DIAGNOSIS — H26491 Other secondary cataract, right eye: Secondary | ICD-10-CM | POA: Diagnosis not present

## 2018-01-08 DIAGNOSIS — Z1231 Encounter for screening mammogram for malignant neoplasm of breast: Secondary | ICD-10-CM | POA: Diagnosis not present

## 2018-01-08 DIAGNOSIS — M8589 Other specified disorders of bone density and structure, multiple sites: Secondary | ICD-10-CM | POA: Diagnosis not present

## 2018-01-08 DIAGNOSIS — Z803 Family history of malignant neoplasm of breast: Secondary | ICD-10-CM | POA: Diagnosis not present

## 2018-01-13 DIAGNOSIS — H26492 Other secondary cataract, left eye: Secondary | ICD-10-CM | POA: Diagnosis not present

## 2018-01-19 ENCOUNTER — Telehealth: Payer: Self-pay | Admitting: Cardiology

## 2018-01-19 NOTE — Telephone Encounter (Signed)
Returned call to patient she stated she received a letter from her insurance saying losartan recalled.Advised to call her pharmacy to see if her losartan lot # has been recalled.Advised to call back if recalled.

## 2018-01-19 NOTE — Telephone Encounter (Signed)
Pt c/o medication issue:  1. Name of Medication: Losartan   2. How are you currently taking this medication (dosage and times per day)? 100mg  // 1x daily   3. Are you having a reaction (difficulty breathing--STAT)? No   4. What is your medication issue? Patient states that she received a letter from pharmacy that medication was being recalled

## 2018-01-21 DIAGNOSIS — N6489 Other specified disorders of breast: Secondary | ICD-10-CM | POA: Diagnosis not present

## 2018-01-21 DIAGNOSIS — N6002 Solitary cyst of left breast: Secondary | ICD-10-CM | POA: Diagnosis not present

## 2018-01-26 ENCOUNTER — Other Ambulatory Visit: Payer: Self-pay | Admitting: Cardiology

## 2018-01-26 NOTE — Telephone Encounter (Signed)
REFILL 

## 2018-01-27 ENCOUNTER — Other Ambulatory Visit: Payer: Self-pay | Admitting: Cardiology

## 2018-01-28 NOTE — Telephone Encounter (Signed)
Rx(s) sent to pharmacy electronically.  

## 2018-03-09 ENCOUNTER — Telehealth: Payer: Self-pay | Admitting: Cardiology

## 2018-03-09 NOTE — Telephone Encounter (Signed)
She should call back when she is down to 2-3 weeks of medication.  The pharmacy may not have a shortage at that time, and we prefer not to switch medications if we don't have to

## 2018-03-09 NOTE — Telephone Encounter (Signed)
LEFT MESSAGE - TO CONTACT OFFICE AS PHARMACIST REQUEST -TO SEE IF BACKORDER IS STILL IN EFFECT.  ANY QUESTION MAY CALL BACK

## 2018-03-09 NOTE — Telephone Encounter (Signed)
New Message:       Pt c/o medication issue:  1. Name of Medication: losartan (COZAAR) 100 MG tablet  2. How are you currently taking this medication (dosage and times per day)? TAKE ONE TABLET BY MOUTH DAILY  3. Are you having a reaction (difficulty breathing--STAT)? No  4. What is your medication issue? Pt is calling due the pharmacy being unable to get this medication. Pt is wanting to know if she can be put on another medication.

## 2018-03-09 NOTE — Telephone Encounter (Signed)
Spoke to patient states she has a 3 month supply of losartan at present time.  RN informed patient   will defer to  Dr Percival Spanish AND [facility] FOR CHANGE SUGGESTION

## 2018-04-26 ENCOUNTER — Other Ambulatory Visit: Payer: Self-pay | Admitting: Cardiology

## 2018-04-26 NOTE — Telephone Encounter (Signed)
Rx request sent to pharmacy.  

## 2018-05-22 ENCOUNTER — Other Ambulatory Visit: Payer: Self-pay | Admitting: Cardiology

## 2018-06-14 DIAGNOSIS — Z79899 Other long term (current) drug therapy: Secondary | ICD-10-CM | POA: Diagnosis not present

## 2018-06-14 DIAGNOSIS — G3184 Mild cognitive impairment, so stated: Secondary | ICD-10-CM | POA: Diagnosis not present

## 2018-06-14 DIAGNOSIS — E7800 Pure hypercholesterolemia, unspecified: Secondary | ICD-10-CM | POA: Diagnosis not present

## 2018-06-14 DIAGNOSIS — I10 Essential (primary) hypertension: Secondary | ICD-10-CM | POA: Diagnosis not present

## 2018-07-11 ENCOUNTER — Other Ambulatory Visit: Payer: Self-pay | Admitting: Cardiology

## 2018-07-12 DIAGNOSIS — Z0000 Encounter for general adult medical examination without abnormal findings: Secondary | ICD-10-CM | POA: Diagnosis not present

## 2018-07-12 DIAGNOSIS — Z1389 Encounter for screening for other disorder: Secondary | ICD-10-CM | POA: Diagnosis not present

## 2018-07-12 DIAGNOSIS — Z23 Encounter for immunization: Secondary | ICD-10-CM | POA: Diagnosis not present

## 2018-07-20 DIAGNOSIS — R4701 Aphasia: Secondary | ICD-10-CM | POA: Diagnosis not present

## 2018-07-20 DIAGNOSIS — Z79899 Other long term (current) drug therapy: Secondary | ICD-10-CM | POA: Diagnosis not present

## 2018-07-20 DIAGNOSIS — E7800 Pure hypercholesterolemia, unspecified: Secondary | ICD-10-CM | POA: Diagnosis not present

## 2018-07-20 DIAGNOSIS — H903 Sensorineural hearing loss, bilateral: Secondary | ICD-10-CM | POA: Diagnosis not present

## 2018-07-20 DIAGNOSIS — Z0000 Encounter for general adult medical examination without abnormal findings: Secondary | ICD-10-CM | POA: Diagnosis not present

## 2018-07-20 DIAGNOSIS — I10 Essential (primary) hypertension: Secondary | ICD-10-CM | POA: Diagnosis not present

## 2018-07-21 ENCOUNTER — Ambulatory Visit
Admission: RE | Admit: 2018-07-21 | Discharge: 2018-07-21 | Disposition: A | Discharge status: Home / Self Care | Payer: PPO | Source: Ambulatory Visit | Attending: Geriatric Medicine | Admitting: Geriatric Medicine

## 2018-07-21 ENCOUNTER — Other Ambulatory Visit: Payer: Self-pay | Admitting: Geriatric Medicine

## 2018-07-21 DIAGNOSIS — R4701 Aphasia: Secondary | ICD-10-CM

## 2018-07-21 MED ORDER — GADOBENATE DIMEGLUMINE 529 MG/ML IV SOLN
13.0000 mL | Freq: Once | INTRAVENOUS | Status: AC | PRN
  Administered 2018-07-21: 13 mL via INTRAVENOUS

## 2018-08-12 ENCOUNTER — Other Ambulatory Visit: Payer: Self-pay | Admitting: Cardiology

## 2018-08-13 DIAGNOSIS — G3184 Mild cognitive impairment, so stated: Secondary | ICD-10-CM | POA: Diagnosis not present

## 2018-08-13 DIAGNOSIS — I10 Essential (primary) hypertension: Secondary | ICD-10-CM | POA: Diagnosis not present

## 2018-09-14 DIAGNOSIS — G3184 Mild cognitive impairment, so stated: Secondary | ICD-10-CM | POA: Diagnosis not present

## 2018-09-14 DIAGNOSIS — I10 Essential (primary) hypertension: Secondary | ICD-10-CM | POA: Diagnosis not present

## 2018-09-15 ENCOUNTER — Ambulatory Visit (INDEPENDENT_AMBULATORY_CARE_PROVIDER_SITE_OTHER): Payer: PPO | Admitting: Adult Health

## 2018-09-15 ENCOUNTER — Encounter: Payer: Self-pay | Admitting: Adult Health

## 2018-09-15 VITALS — BP 100/68 | HR 70 | Ht 61.0 in | Wt 138.0 lb

## 2018-09-15 DIAGNOSIS — R002 Palpitations: Secondary | ICD-10-CM

## 2018-09-15 DIAGNOSIS — I10 Essential (primary) hypertension: Secondary | ICD-10-CM

## 2018-09-15 DIAGNOSIS — R0602 Shortness of breath: Secondary | ICD-10-CM

## 2018-09-15 MED ORDER — ATENOLOL 25 MG PO TABS: 25.0000 mg | ORAL_TABLET | Freq: Every day | ORAL | 3 refills | Status: DC

## 2018-09-15 MED ORDER — AMLODIPINE BESYLATE 5 MG PO TABS: 2.5000 mg | ORAL_TABLET | Freq: Every day | ORAL | 3 refills | Status: DC

## 2018-09-15 NOTE — Progress Notes (Signed)
Cardiology Office Note   Date:  09/15/2018   ID:  MARELIN TAT, Terri Wiggins 05-02-1934, MRN [id number]  PCP:  Lajean Manes, MD  Cardiologist:  Dr. Percival Spanish  Chief Complaint  Patient presents with  . Hypertension     History of Present Illness: Terri Wiggins is a 84 y.o. female who presents for ongoing assessment and management of hypertension and dyspnea. She has GXT which was negative two years ago. She continues to have mild DOE. No changes were made in her therapy when seen last 05/14/2017.   She has been diagnosed with demential by her PCP since being seen last. He has her on Aricept. Due to hypotension, Dr.Stoneking reduced atenolol from 50 mg daily to 25 mg daily. She cannot tell the difference in how she feels.  Breathing status is unchanged. She states that her memory is becoming a problem for her now. She has a friend who is with her that helps with her medications and accompanies her to provider visits.   Past Medical History:  Diagnosis Date  . Allergic rhinitis   . Bilateral impacted cerumen   . Cataract 2012   surgery to remove bilateral cataracts  . Dysthymic disorder   . Hearing loss    bilateral - wears hearing aids  . HLD (hyperlipidemia)   . HTN (hypertension)   . Osteoarthritis    hands, knees  . Osteoarthritis   . PMS (premenstrual syndrome)   . SOB (shortness of breath) on exertion   . Wears partial dentures    upper and lower    Past Surgical History:  Procedure Laterality Date  . cataract surgery  2012   bilateral   . COLONOSCOPY     [facility]   . DILATION AND CURETTAGE OF UTERUS    . THYROID LOBECTOMY    . TONSILLECTOMY    . WISDOM TOOTH EXTRACTION       Current Outpatient Medications  Medication Sig Dispense Refill  . amLODipine (NORVASC) 5 MG tablet Take 0.5 tablets (2.5 mg total) by mouth daily. 90 tablet 3  . atenolol (TENORMIN) 25 MG tablet Take 1 tablet (25 mg total) by mouth daily. 90 tablet 3  . Calcium 600-200 MG-UNIT tablet Take 1  tablet by mouth daily.    Marland Kitchen donepezil (ARICEPT) 5 MG tablet Take 5 mg by mouth daily.    . Ginkgo Biloba 60 MG CAPS Take by mouth daily.      Marland Kitchen ibuprofen (ADVIL,MOTRIN) 200 MG tablet Take 200 mg by mouth every 6 (six) hours as needed. Reported on 01/08/2016    . losartan (COZAAR) 100 MG tablet TAKE ONE TABLET BY MOUTH DAILY 90 tablet 0  . Multiple Vitamins-Iron (MULTIVITAMIN/IRON PO) Take 1 tablet by mouth daily.    . Omega-3 Fatty Acids (FISH OIL) 1200 MG CAPS Take by mouth daily.      . phenylephrine (NEO-SYNEPHRINE) 0.5 % nasal solution Place 1 drop into both nostrils every 6 (six) hours as needed for congestion.    . simvastatin (ZOCOR) 40 MG tablet Take 1 tablet by mouth daily.     No current facility-administered medications for this visit.     Allergies:   Codeine    Social History:  The patient  reports that she quit smoking about 43 years ago. Her smoking use included cigarettes. She has a 10.50 pack-year smoking history. She has never used smokeless tobacco. She reports that she drinks about 21.0 standard drinks of alcohol per week. She reports that she does not  use drugs.   Family History:  The patient's family history includes Arthritis in her unknown relative; Breast cancer in her unknown relative; Heart disease in her unknown relative; Heart failure in her unknown relative; Hypertension in her unknown relative; Narcolepsy in her unknown relative; Rheum arthritis in her unknown relative.    ROS: All other systems are reviewed and negative. Unless otherwise mentioned in H&P    PHYSICAL EXAM: VS:  BP 100/68   Pulse 70   Ht 5\' 1"  (1.549 m)   Wt 138 lb (62.6 kg)   SpO2 98%   BMI 26.07 kg/m  , BMI Body mass index is 26.07 kg/m. GEN: Well nourished, well developed, in no acute distress HEENT: normal Neck: no JVD, carotid bruits, or masses Cardiac: RRR; no murmurs, rubs, or gallops,no edema  Respiratory:  Clear to auscultation bilaterally, normal work of breathing GI: soft,  nontender, nondistended, + BS MS: no deformity or atrophy Skin: warm and dry, no rash Neuro:  Strength and sensation are intact Psych: euthymic mood, full affect   EKG:  NSR with occasional PVC's rate of 70.   Recent Labs: No results found for requested labs within last 8760 hours.    Lipid Panel    Component Value Date/Time   CHOL 163 08/06/2009 1104   TRIG 86.0 08/06/2009 1104   HDL 55.80 08/06/2009 1104   CHOLHDL 3 08/06/2009 1104   VLDL 17.2 08/06/2009 1104   LDLCALC 90 08/06/2009 1104      Wt Readings from Last 3 Encounters:  09/15/18 138 lb (62.6 kg)  05/14/17 149 lb 9.6 oz (67.9 kg)  05/05/17 150 lb (68 kg)      Other studies Reviewed: Echocardiogram 07/10/15 Left ventricle: The cavity size was normal. Wall thickness was   normal. Systolic function was normal. The estimated ejection   fraction was in the range of 60% to 65%. Wall motion was normal;   there were no regional wall motion abnormalities. Doppler   parameters are consistent with abnormal left ventricular   relaxation (grade 1 diastolic dysfunction). - Aortic valve: There was trivial regurgitation.  Stress GXT 01/31/2016  Study Highlights    There was no ST segment deviation noted during stress.  No T wave inversion was noted during stress.  The patient experienced no angina during the stress test.  The test was stopped because the patient complained of shortness of breath  Blood pressure and heart rate demonstrated a normal response to exercise.  [facility] Treadmill Score: low risk   Negative stress test without evidence of ischemia at given workload.      ASSESSMENT AND PLAN:  1.  Hypertension: BP is still low even though atenolol has been reduced. HR is okay. I will decrease amlodipine to 2.5 mg from 5 mg. Her friend will cut the 5 mg dose in half for her as she prepares her medications. She has a follow up appt with Dr. Felipa Eth in 3 weeks. She will keep a record of her BP to take  with her to follow up appointment. She will see cardiology in 3 months.    If HR begins to decrease would stop atenolol completely, as use of Aricept has been shown to cause bradycardia.   2. Palpitations:  No complaints of this today.   3. Dyspnea: Continues to have some mild DOE when climbing stairs but not with minimal exertion.   Current medicines are reviewed at length with the patient today.    Labs/ tests ordered today include: None  Phill Myron. West Pugh, ANP, AACC   09/15/2018 2:39 PM    [address] Office [phone] Fax [phone]

## 2018-09-15 NOTE — Patient Instructions (Signed)
Medication Instructions:  DECREASE AMLODIPINE 2.5 MG DAILY  If you need a refill on your cardiac medications before your next appointment, please call your pharmacy.  Special Instructions: CONTINUE TO LOG YOUR BP AND CALL us TO LET us KNOW HOW YOU ARE DOING ON THE DECREASED DOSE  Follow-Up: You will need a follow up appointment in 3 MONTHS.  You may see  DR Hans Eden, DNP, ANP or one of the following Advanced Practice Providers on your designated Care Team:    . Jory Sims, DNP, ANP . Rosaria Ferries, PA-C  At [facility], you and your health needs are our priority.  As part of our continuing mission to provide you with exceptional heart care, we have created designated Provider Care Teams.  These Care Teams include your primary Cardiologist (physician) and Advanced Practice Providers (APPs -  Physician Assistants and Nurse Practitioners) who all work together to provide you with the care you need, when you need it.  Labwork: If you have labs (blood work) drawn today and your tests are completely normal, you will receive your results only by: Marland Kitchen MyChart Message (if you have MyChart) OR . A paper copy in the mail If you have any lab test that is abnormal or we need to change your treatment, we will call you to review the results Thank you for choosing [facility] at [facility]!!

## 2018-09-16 ENCOUNTER — Telehealth: Payer: Self-pay | Admitting: Adult Health

## 2018-09-16 NOTE — Telephone Encounter (Signed)
S/w Terri Wiggins(friend was with her at aappt yesterday) she states that she tried to cut the atenolol 25mg  in half as directed. After viewing yesterday's PN she was incorrect. Per Note: BP is still low even though atenolol has been reduced. HR is okay. I will decrease amlodipine to 2.5 mg from 5 mg.  Informed Terri Wiggins she she was incorrect and that the medication was to cut in 1/2 was the atenolol 25mg . She sates that she does not have that medication in front of her but she will make that change and will call back in the am if anything else is needed.

## 2018-09-17 NOTE — Telephone Encounter (Signed)
Returned call to pt she states that she cut the medication in 1/2 and it cut fine. She will continue and CB if anything is needed

## 2018-10-12 DIAGNOSIS — I10 Essential (primary) hypertension: Secondary | ICD-10-CM | POA: Diagnosis not present

## 2018-10-12 DIAGNOSIS — G3184 Mild cognitive impairment, so stated: Secondary | ICD-10-CM | POA: Diagnosis not present

## 2018-10-20 ENCOUNTER — Telehealth: Payer: Self-pay | Admitting: Adult Health

## 2018-10-20 NOTE — Telephone Encounter (Signed)
Looks like her BP is coming up nicely with reduction of amlodipine from 5 mg to 2.5 mg. Will keep it there unless she has dizziness.  Thank her for letting me know.

## 2018-10-20 NOTE — Telephone Encounter (Signed)
Informed and verbalized understanding

## 2018-10-20 NOTE — Telephone Encounter (Signed)
  Pt c/o BP issue: STAT if pt c/o blurred vision, one-sided weakness or slurred speech  1. What are your last 5 BP readings? 105/61 P56, 133/81, 126/80, 110/70  2. Are you having any other symptoms (ex. Dizziness, headache, blurred vision, passed out)? no  3. What is your BP issue? Arnold Long requested patient take readings and call back with numbers

## 2018-11-22 ENCOUNTER — Other Ambulatory Visit: Payer: Self-pay | Admitting: Cardiology

## 2018-11-22 NOTE — Telephone Encounter (Signed)
Rx has been sent to the pharmacy electronically. ° °

## 2018-11-23 ENCOUNTER — Telehealth: Payer: Self-pay | Admitting: *Deleted

## 2018-11-23 MED ORDER — LOSARTAN POTASSIUM 100 MG PO TABS: 100.0000 mg | ORAL_TABLET | Freq: Every day | ORAL | 0 refills | Status: DC

## 2018-11-23 NOTE — Telephone Encounter (Signed)
FAMILY FRIEND  -JEANETTE  CAME BY OFFICE FOR PATIENT - PATIENT REQUEST  REFILL , SHE WAS UNABLE TO COME TO OFFICE DUE TO TAKING CARE OF HUSBAND.  MEDICATION E-SENT TO PHARMACY LEFT MESSAGE FOR PATIENT

## 2018-11-26 ENCOUNTER — Encounter: Payer: Self-pay | Admitting: Cardiology

## 2018-11-26 ENCOUNTER — Other Ambulatory Visit: Payer: Self-pay | Admitting: *Deleted

## 2018-11-26 MED ORDER — LOSARTAN POTASSIUM 100 MG PO TABS: 100.0000 mg | ORAL_TABLET | Freq: Every day | ORAL | 1 refills | Status: DC

## 2018-12-01 DIAGNOSIS — I10 Essential (primary) hypertension: Secondary | ICD-10-CM | POA: Diagnosis not present

## 2018-12-01 DIAGNOSIS — R350 Frequency of micturition: Secondary | ICD-10-CM | POA: Diagnosis not present

## 2018-12-01 DIAGNOSIS — F0281 Dementia in other diseases classified elsewhere with behavioral disturbance: Secondary | ICD-10-CM | POA: Diagnosis not present

## 2018-12-01 DIAGNOSIS — G301 Alzheimer's disease with late onset: Secondary | ICD-10-CM | POA: Diagnosis not present

## 2018-12-01 DIAGNOSIS — Z79899 Other long term (current) drug therapy: Secondary | ICD-10-CM | POA: Diagnosis not present

## 2018-12-15 NOTE — Progress Notes (Signed)
HPI Patient presents for followup of dyspnea and hypertension.  Recently she has had her meds titrated down because of low BPs.  She had some systolic blood pressures range in the 102 range.  Heart rate was in the 30s.  Amlodipine was discontinued.  Her friend is a Marine scientist who checks her rhythm.  She has been diagnosed with dementia and has lost weight.  She forgets to eat.  She is also under stress because her husband broke a leg.  The patient denies any new symptoms such as chest discomfort, neck or arm discomfort. There has been no new shortness of breath, PND or orthopnea. There have been no reported palpitations, presyncope or syncope.   Allergies  Allergen Reactions  . Codeine Nausea And Vomiting   Prior to Admission medications   Medication Sig Start Date End Date Taking? Authorizing Provider  atenolol (TENORMIN) 25 MG tablet Take 1 tablet (25 mg total) by mouth daily. 11/22/18  Yes Minus Breeding, MD  Calcium 600-200 MG-UNIT tablet Take 1 tablet by mouth daily.   Yes [provider]  Ginkgo Biloba 60 MG CAPS Take by mouth daily.     Yes [provider]  ibuprofen (ADVIL,MOTRIN) 200 MG tablet Take 200 mg by mouth every 6 (six) hours as needed. Reported on 01/08/2016   Yes [provider]  losartan (COZAAR) 100 MG tablet Take 1 tablet (100 mg total) by mouth daily. KEEP APPOINTMENT ON 12/16/18 11/26/18  Yes Minus Breeding, MD  [facility]) 10 MG tablet  12/07/18  Yes [provider]  Multiple Vitamins-Iron (MULTIVITAMIN/IRON PO) Take 1 tablet by mouth daily.   Yes [provider]  Omega-3 Fatty Acids (FISH OIL) 1200 MG CAPS Take by mouth daily.     Yes [provider]  phenylephrine (NEO-SYNEPHRINE) 0.5 % nasal solution Place 1 drop into both nostrils every 6 (six) hours as needed for congestion.   Yes [provider]  simvastatin (ZOCOR) 40 MG tablet Take 1 tablet by mouth daily. 09/11/16  Yes [provider]       Past Medical History:  Diagnosis Date  . Allergic rhinitis   . Bilateral impacted cerumen   . Cataract 2012   surgery to remove bilateral cataracts  . Dysthymic disorder   . Hearing loss    bilateral - wears hearing aids  . HLD (hyperlipidemia)   . HTN (hypertension)   . Osteoarthritis    hands, knees  . Osteoarthritis   . PMS (premenstrual syndrome)   . SOB (shortness of breath) on exertion   . Wears partial dentures    upper and lower    Past Surgical History:  Procedure Laterality Date  . cataract surgery  2012   bilateral   . COLONOSCOPY     [facility]   . DILATION AND CURETTAGE OF UTERUS    . THYROID LOBECTOMY    . TONSILLECTOMY    . WISDOM TOOTH EXTRACTION      ROS:   As stated in the HPI and negative for all other systems.  PHYSICAL EXAM BP 126/82   Pulse 69   Ht 5' 1.5" (1.562 m)   Wt 133 lb 9.6 oz (60.6 kg)   BMI 24.83 kg/m   GENERAL:  Well appearing NECK:  No jugular venous distention, waveform within normal limits, carotid upstroke brisk and symmetric, no bruits, no thyromegaly LUNGS:  Clear to auscultation bilaterally CHEST:  Unremarkable HEART:  PMI not displaced or sustained,S1 and S2 within normal  limits, no S3, no S4, no clicks, no rubs, no murmurs ABD:  Flat, positive bowel sounds normal in frequency in pitch, no bruits, no rebound, no guarding, no midline pulsatile mass, no hepatomegaly, no splenomegaly EXT:  2 plus pulses throughout, no edema, no cyanosis no clubbing    EKG:  Sinus rhythm, rate 90, axis within normal limits, intervals within normal limits, no acute ST-T wave changes. Premature ectopic complexes    12/16/2018   ASSESSMENT AND PLAN  HTN:    BPs have been low recently.  She can remain on the current dose of beta-blocker unless she has lower blood pressures or heart rates at which point I would discontinue these.   BRUITS:   She had only mild plaque in 2016.  No further imaging is indicated.  SOB:   She is not  complaining of this currently.  No change in therapy.

## 2018-12-16 ENCOUNTER — Encounter: Payer: Self-pay | Admitting: Cardiology

## 2018-12-16 ENCOUNTER — Ambulatory Visit: Payer: PPO | Admitting: Cardiology

## 2018-12-16 VITALS — BP 126/82 | HR 69 | Ht 61.5 in | Wt 133.6 lb

## 2018-12-16 DIAGNOSIS — I952 Hypotension due to drugs: Secondary | ICD-10-CM | POA: Diagnosis not present

## 2018-12-16 NOTE — Patient Instructions (Addendum)
Medication Instructions:  Continue current medications  If you need a refill on your cardiac medications before your next appointment, please call your pharmacy.  Labwork: None Ordered   Take the provided lab slips with you to the lab for your blood draw.   When you have your labs (blood work) drawn today and your tests are completely normal, you will receive your results only by MyChart Message (if you have MyChart) -OR-  A paper copy in the mail.  If you have any lab test that is abnormal or we need to change your treatment, we will call you to review these results.  Testing/Procedures: None Ordered  Follow-Up: . You will need a follow up appointment in 6 Months with Jory Sims.   At [facility], you and your health needs are our priority.  As part of our continuing mission to provide you with exceptional heart care, we have created designated Provider Care Teams.  These Care Teams include your primary Cardiologist (physician) and Advanced Practice Providers (APPs -  Physician Assistants and Nurse Practitioners) who all work together to provide you with the care you need, when you need it.  Thank you for choosing [facility] at [facility]!!

## 2019-02-04 DIAGNOSIS — I10 Essential (primary) hypertension: Secondary | ICD-10-CM | POA: Diagnosis not present

## 2019-02-04 DIAGNOSIS — G301 Alzheimer's disease with late onset: Secondary | ICD-10-CM | POA: Diagnosis not present

## 2019-02-04 DIAGNOSIS — H2512 Age-related nuclear cataract, left eye: Secondary | ICD-10-CM | POA: Diagnosis not present

## 2019-02-04 DIAGNOSIS — F0281 Dementia in other diseases classified elsewhere with behavioral disturbance: Secondary | ICD-10-CM | POA: Diagnosis not present

## 2019-02-17 ENCOUNTER — Other Ambulatory Visit: Payer: Self-pay | Admitting: Cardiology

## 2019-02-21 ENCOUNTER — Telehealth: Payer: Self-pay | Admitting: Cardiology

## 2019-02-21 NOTE — Telephone Encounter (Signed)
New Message:    She needs to know if pt I s supposed to be taking Losartan and Atenolol?

## 2019-02-21 NOTE — Telephone Encounter (Signed)
New Message:    Pt needs to know if pt is supposed to be taking Losartan and Atenolol?

## 2019-02-21 NOTE — Telephone Encounter (Signed)
Do not need this encounter °

## 2019-02-21 NOTE — Telephone Encounter (Signed)
Pt's daughter called the office back please give her a call.

## 2019-02-22 ENCOUNTER — Telehealth: Payer: Self-pay | Admitting: Cardiology

## 2019-02-22 NOTE — Telephone Encounter (Signed)
  Patient would like [place] to [facility].

## 2019-02-22 NOTE — Telephone Encounter (Signed)
Left message for patient, pharmacy [facility] [address]. If not correct or if she needs refills the patient was asked to call back.

## 2019-03-14 ENCOUNTER — Other Ambulatory Visit: Payer: Self-pay | Admitting: Cardiology

## 2019-03-14 NOTE — Telephone Encounter (Signed)
Left message to call back  

## 2019-03-14 NOTE — Telephone Encounter (Signed)
New message:   Patient son calling concering some medications that his mother is taken. Please call patient son. Her Son Nicki Reaper [phone]

## 2019-03-15 ENCOUNTER — Telehealth: Payer: Self-pay | Admitting: Cardiology

## 2019-03-15 NOTE — Telephone Encounter (Signed)
Lm with pt once again to see if may speak with son re medical care .Adonis Housekeeper

## 2019-03-15 NOTE — Telephone Encounter (Signed)
New message   Patient's son is returning call from yesterday. Please call.

## 2019-03-15 NOTE — Telephone Encounter (Signed)
Lm to call back ./cy 

## 2019-03-16 NOTE — Telephone Encounter (Signed)
Received call from patient's son Shanon Brow mother gave me permission to speak to him.Stated he is helping her with her medications.He wanted to verify medications.Medications reviewed.DPR form mailed to patient to update.

## 2019-03-16 NOTE — Telephone Encounter (Signed)
See previous 5/13 telephone note.

## 2019-03-16 NOTE — Telephone Encounter (Signed)
Follow Up    Pts son is returning call He says he doesn't think she is taking the right medication and  Is needing someone to speak with right away. He says he has been trying to get this done for 3 days now    Please call back

## 2019-03-17 ENCOUNTER — Other Ambulatory Visit: Payer: Self-pay | Admitting: Cardiology

## 2019-04-04 DIAGNOSIS — R197 Diarrhea, unspecified: Secondary | ICD-10-CM | POA: Diagnosis not present

## 2019-04-04 DIAGNOSIS — I10 Essential (primary) hypertension: Secondary | ICD-10-CM | POA: Diagnosis not present

## 2019-04-04 DIAGNOSIS — G301 Alzheimer's disease with late onset: Secondary | ICD-10-CM | POA: Diagnosis not present

## 2019-04-04 DIAGNOSIS — F0281 Dementia in other diseases classified elsewhere with behavioral disturbance: Secondary | ICD-10-CM | POA: Diagnosis not present

## 2019-04-04 DIAGNOSIS — E441 Mild protein-calorie malnutrition: Secondary | ICD-10-CM | POA: Diagnosis not present

## 2019-06-07 ENCOUNTER — Telehealth: Payer: Self-pay | Admitting: Cardiology

## 2019-06-07 ENCOUNTER — Other Ambulatory Visit: Payer: Self-pay | Admitting: Cardiology

## 2019-06-07 MED ORDER — LOSARTAN POTASSIUM 100 MG PO TABS: 100.0000 mg | ORAL_TABLET | Freq: Every day | ORAL | 1 refills | Status: AC

## 2019-06-07 MED ORDER — ATENOLOL 25 MG PO TABS: 25.0000 mg | ORAL_TABLET | Freq: Every day | ORAL | 1 refills | Status: DC

## 2019-06-07 NOTE — Telephone Encounter (Signed)
New message    *STAT* If patient is at the pharmacy, call can be transferred to refill team.   1. Which medications need to be refilled? (please list name of each medication and dose if known) atenolol (TENORMIN) 25 MG tablet     losartan (COZAAR) 100 MG tablet  2. Which pharmacy/location (including street and city if local pharmacy) is medication to be sent to? [id number] [place] [place] [address] [facility] [address]  3. Do they need a 30 day or 90 day supply? 90 day supply

## 2019-06-07 NOTE — Telephone Encounter (Signed)
Added phone number

## 2019-06-29 NOTE — Progress Notes (Unsigned)
{Choose 1 Note Type (Telehealth Visit or Telephone Visit):[phone]}   Date:  06/29/2019   ID:  Abaigeal Baab, DOB 07/03/1934, MRN [id number]  {Patient Location:[phone]::"Home"} {Provider Location:[phone]::"Home"}  PCP:  Lajean Manes, MD  Cardiologist:  Dr. Percival Spanish  Electrophysiologist:  None   Evaluation Performed:  {Choose Visit [id number] Visit"}  Chief Complaint:  ***  History of Present Illness:    Terri Wiggins is a 85 y.o. female with we are following for ongoing assessment and management of hypertension with symptoms of chronic shortness of breath, and hyperlipidemia.  She was last seen by Dr. Percival Spanish in 12/16/2018, where she endorsed significant symptoms of anxiety as she was caring for her husband with a broken leg.  She is recently had been diagnosed with dementia, often forgetting to eat.  No medication changes were made at the last office visit.  She was to continue her current medication regimen and see [place] in follow-up every 6 months.  The patient {does/does not:200015} have symptoms concerning for COVID-19 infection (fever, chills, cough, or new shortness of breath).    Past Medical History:  Diagnosis Date  . Allergic rhinitis   . Bilateral impacted cerumen   . Cataract 2012   surgery to remove bilateral cataracts  . Dysthymic disorder   . Hearing loss    bilateral - wears hearing aids  . HLD (hyperlipidemia)   . HTN (hypertension)   . Osteoarthritis    hands, knees  . Osteoarthritis   . PMS (premenstrual syndrome)   . SOB (shortness of breath) on exertion   . Wears partial dentures    upper and lower   Past Surgical History:  Procedure Laterality Date  . cataract surgery  2012   bilateral   . COLONOSCOPY     [facility]   . DILATION AND CURETTAGE OF UTERUS    . THYROID LOBECTOMY    . TONSILLECTOMY    . WISDOM TOOTH EXTRACTION       No outpatient medications have been marked as taking for the 06/30/19 encounter  (Appointment) with Lendon Colonel, NP.     Allergies:   Codeine   Social History   Tobacco Use  . Smoking status: Former Smoker    Packs/day: 0.50    Years: 21.00    Pack years: 10.50    Types: Cigarettes    Quit date: 11/03/1974    Years since quitting: 44.6  . Smokeless tobacco: Never Used  Substance Use Topics  . Alcohol use: Yes    Alcohol/week: 21.0 standard drinks    Types: 21 Glasses of wine per week    Comment: 3 glasses/day  . Drug use: No     Family Hx: The patient's family history includes Arthritis in an other family member; Breast cancer in an other family member; Heart disease in an other family member; Heart failure in an other family member; Hypertension in an other family member; Narcolepsy in an other family member; Rheum arthritis in an other family member. There is no history of Colon cancer, Colon polyps, Esophageal cancer, Rectal cancer, or Stomach cancer.  ROS:   Please see the history of present illness.    *** All other systems reviewed and are negative.   Prior CV studies:   The following studies were reviewed today:  ***  Labs/Other Tests and Data Reviewed:    EKG:  {EKG/Telemetry Strips Reviewed:[phone]}  Recent Labs: No results found for requested labs within last 8760 hours.   Recent Lipid Panel Lab Results  Component Value Date/Time   CHOL 163 08/06/2009 11:04 AM   TRIG 86.0 08/06/2009 11:04 AM   HDL 55.80 08/06/2009 11:04 AM   CHOLHDL 3 08/06/2009 11:04 AM   LDLCALC 90 08/06/2009 11:04 AM    Wt Readings from Last 3 Encounters:  12/16/18 133 lb 9.6 oz (60.6 kg)  09/15/18 138 lb (62.6 kg)  05/14/17 149 lb 9.6 oz (67.9 kg)     Objective:    Vital Signs:  There were no vitals taken for this visit.   {HeartCare Virtual Exam (Optional):[phone]::"VITAL SIGNS:  reviewed"}  ASSESSMENT & PLAN:    1. ***  COVID-19 Education: The signs and symptoms of COVID-19 were discussed with the patient and how to seek care for  testing (follow up with PCP or arrange E-visit).  ***The importance of social distancing was discussed today.  Time:   Today, I have spent *** minutes with the patient with telehealth technology discussing the above problems.     Medication Adjustments/Labs and Tests Ordered: Current medicines are reviewed at length with the patient today.  Concerns regarding medicines are outlined above.   Tests Ordered: No orders of the defined types were placed in this encounter.   Medication Changes: No orders of the defined types were placed in this encounter.   Disposition:  Follow up {follow up:15908}  Signed, Phill Myron. West Pugh, ANP, AACC  06/29/2019 1:20 PM    [facility]

## 2019-06-30 ENCOUNTER — Telehealth: Payer: PPO | Admitting: Adult Health

## 2019-07-25 DIAGNOSIS — R634 Abnormal weight loss: Secondary | ICD-10-CM | POA: Diagnosis not present

## 2019-07-25 DIAGNOSIS — Z1331 Encounter for screening for depression: Secondary | ICD-10-CM | POA: Diagnosis not present

## 2019-07-25 DIAGNOSIS — G301 Alzheimer's disease with late onset: Secondary | ICD-10-CM | POA: Diagnosis not present

## 2019-07-25 DIAGNOSIS — H6123 Impacted cerumen, bilateral: Secondary | ICD-10-CM | POA: Diagnosis not present

## 2019-07-25 DIAGNOSIS — F0281 Dementia in other diseases classified elsewhere with behavioral disturbance: Secondary | ICD-10-CM | POA: Diagnosis not present

## 2019-07-25 DIAGNOSIS — Z0000 Encounter for general adult medical examination without abnormal findings: Secondary | ICD-10-CM | POA: Diagnosis not present

## 2019-07-25 DIAGNOSIS — Z79899 Other long term (current) drug therapy: Secondary | ICD-10-CM | POA: Diagnosis not present

## 2019-07-25 DIAGNOSIS — Z23 Encounter for immunization: Secondary | ICD-10-CM | POA: Diagnosis not present

## 2019-07-25 DIAGNOSIS — I10 Essential (primary) hypertension: Secondary | ICD-10-CM | POA: Diagnosis not present

## 2019-07-25 DIAGNOSIS — E7800 Pure hypercholesterolemia, unspecified: Secondary | ICD-10-CM | POA: Diagnosis not present

## 2019-08-26 ENCOUNTER — Other Ambulatory Visit: Payer: Self-pay | Admitting: Cardiology

## 2019-12-19 DIAGNOSIS — F0281 Dementia in other diseases classified elsewhere with behavioral disturbance: Secondary | ICD-10-CM | POA: Diagnosis not present

## 2019-12-19 DIAGNOSIS — S0121XA Laceration without foreign body of nose, initial encounter: Secondary | ICD-10-CM | POA: Diagnosis not present

## 2019-12-19 DIAGNOSIS — I10 Essential (primary) hypertension: Secondary | ICD-10-CM | POA: Diagnosis not present

## 2019-12-19 DIAGNOSIS — G301 Alzheimer's disease with late onset: Secondary | ICD-10-CM | POA: Diagnosis not present

## 2019-12-23 IMAGING — MR MR HEAD WO/W CM
12 series · 48 of 48 positions shown · IV contrast (multihance)
Comparison: None.

CLINICAL DATA: Expressive aphasia for 2 weeks. Possible TIA or
stroke. Hypertension and hyperlipidemia.

EXAM:
MRI HEAD WITHOUT AND WITH CONTRAST
TECHNIQUE: Multiplanar, multiecho pulse sequences of the brain and surrounding
structures were obtained without and with intravenous contrast.
CONTRAST:  13mL MULTIHANCE GADOBENATE DIMEGLUMINE 529 MG/ML IV SOLN

[Series 2: t1_se_sag · sagittal · 5.0mm · 0.45mm/px · 1 of 21 slices shown]
[im 1/21]
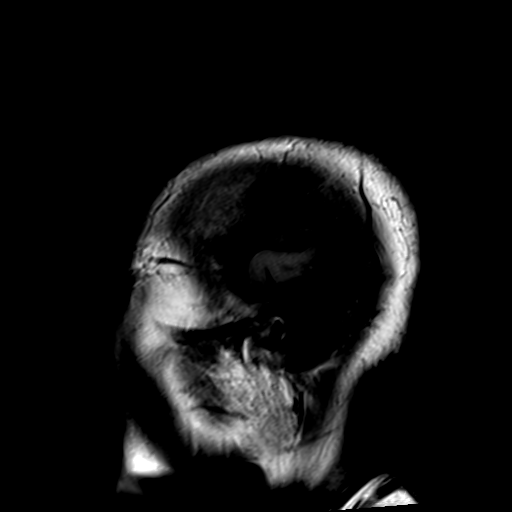

[Series 3: ep2d_diff_(id)_trace · axial · 3.0mm · 1.80mm/px · z∈[-65,+75]mm · 5 of 91 slices shown]
[im 1/91]
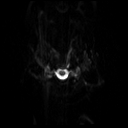
[im 23/91]
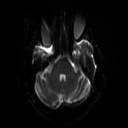
[im 46/91]
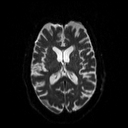
[im 68/91]
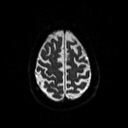
[im 91/91]
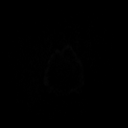

[Series 4: ep2d_diff_(id)_trace_adc · axial · 3.0mm · 1.80mm/px · z∈[-65,+75]mm · 3 of 47 slices shown]
[im 1/47]
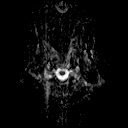
[im 24/47]
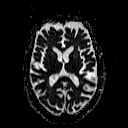
[im 47/47]
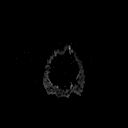

[Series 5: ep2d_diff_cor · coronal · 5.0mm · 1.77mm/px · 4 of 54 slices shown]
[im 1/54]
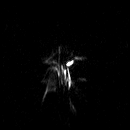
[im 18/54]
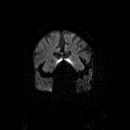
[im 36/54]
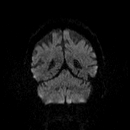
[im 54/54]
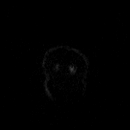

[Series 6: ep2d_diff_cor_adc · coronal · 5.0mm · 1.77mm/px · 2 of 27 slices shown]
[im 1/27]
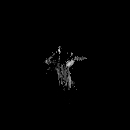
[im 27/27]
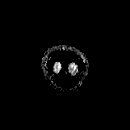

[Series 7: FLAIR · axial · 3.0mm · 0.45mm/px · z∈[-76,+86]mm · 2 of 28 slices shown]
[im 1/28]
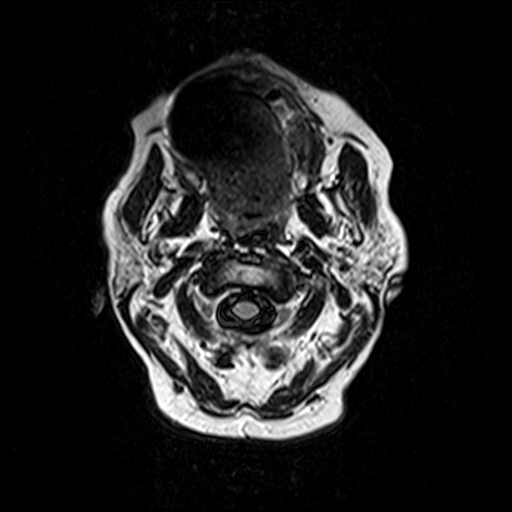
[im 28/28]
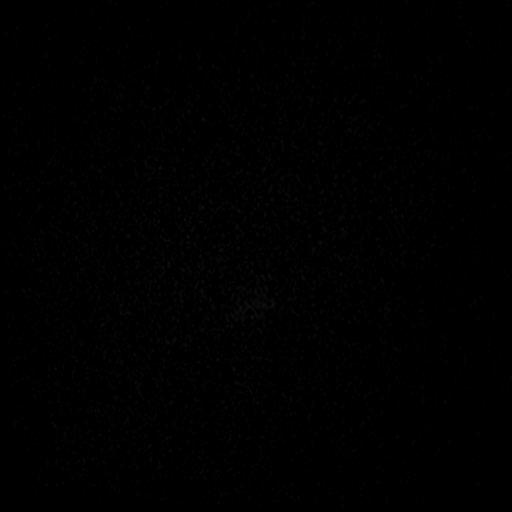

[Series 8: t2_tse_tra · axial · 5.0mm · 0.60mm/px · z∈[-68,+82]mm · 2 of 26 slices shown]
[im 1/26]
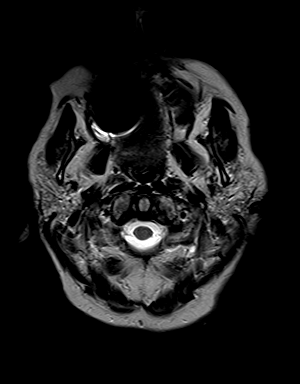
[im 26/26]
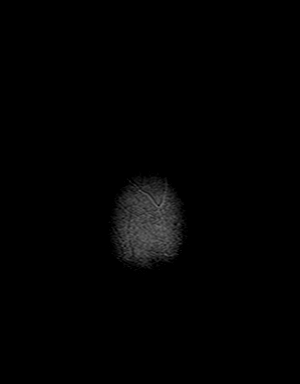

[Series 10: swi_images · axial · 2.0mm · 0.90mm/px · z∈[-72,+86]mm · 5 of 80 slices shown]
[im 1/80]
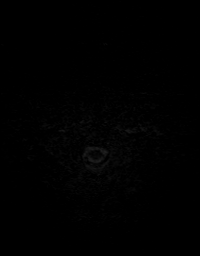
[im 20/80]
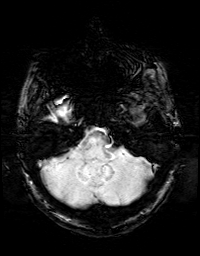
[im 40/80]
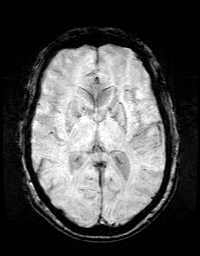
[im 60/80]
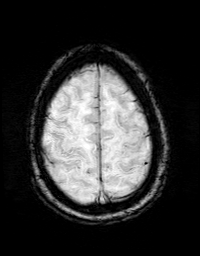
[im 80/80]
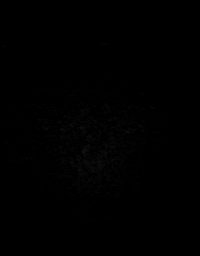

[Series 11: t1_mpr_tra · axial · 1.0mm · 0.72mm/px · z∈[-64,+78]mm · 10 of 144 slices shown]
[im 1/144]
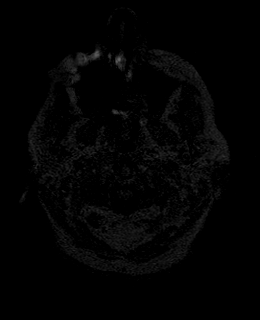
[im 16/144]
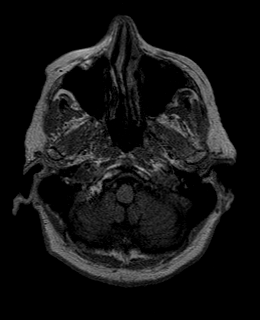
[im 32/144]
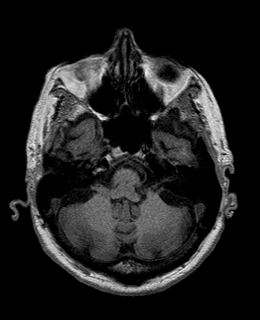
[im 48/144]
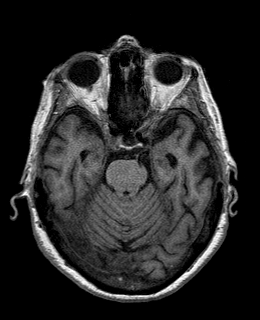
[im 64/144]
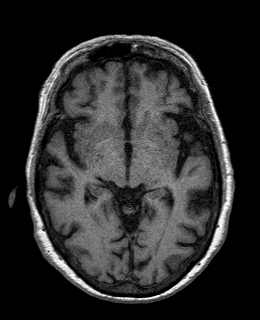
[im 80/144]
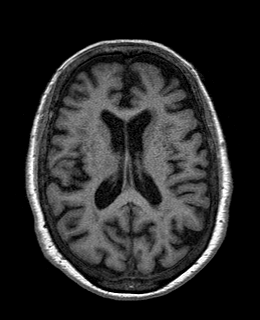
[im 96/144]
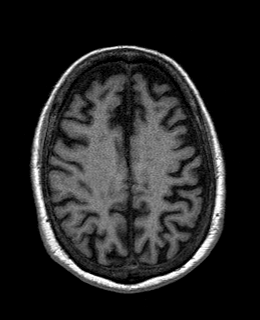
[im 112/144]
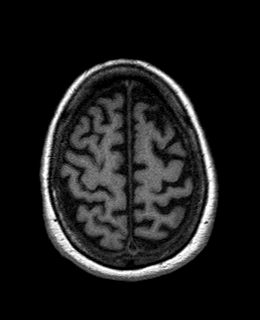
[im 128/144]
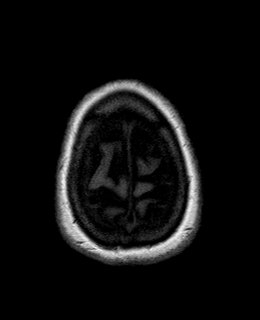
[im 144/144]
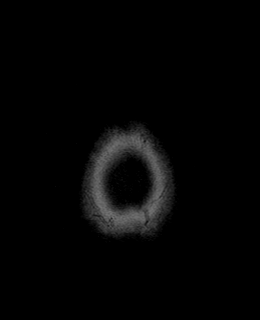

[Series 12: T2 post-contrast · coronal · 5.0mm · 0.45mm/px · 2 of 30 slices shown]
[im 1/30]
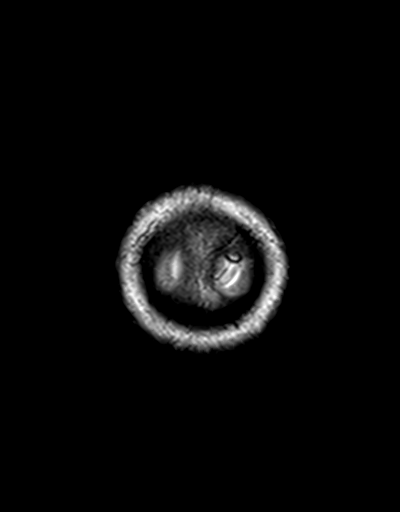
[im 30/30]
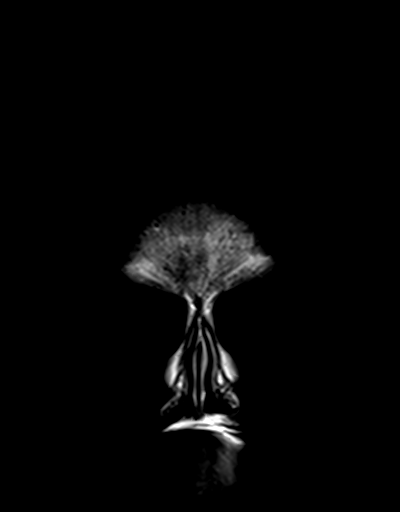

[Series 13: post t1_mpr_tra · axial · 1.0mm · 0.72mm/px · z∈[-64,+78]mm · 10 of 144 slices shown]
[im 1/144]
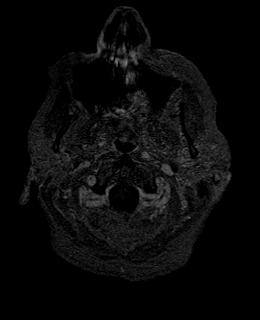
[im 16/144]
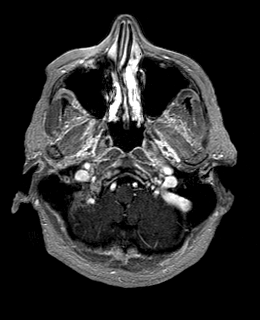
[im 32/144]
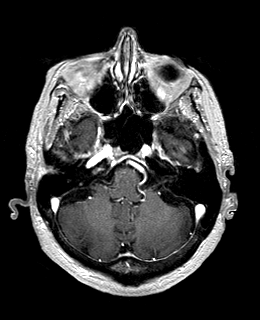
[im 48/144]
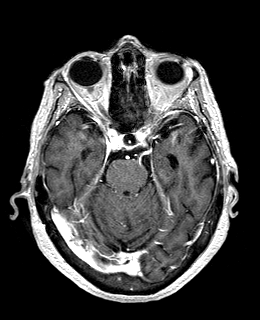
[im 64/144]
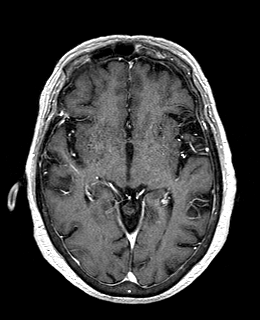
[im 80/144]
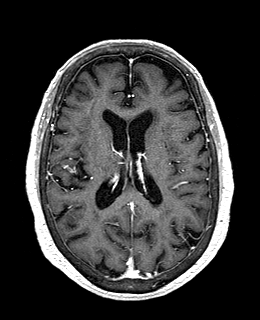
[im 96/144]
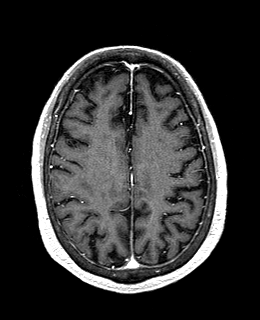
[im 112/144]
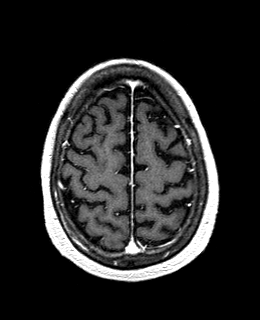
[im 128/144]
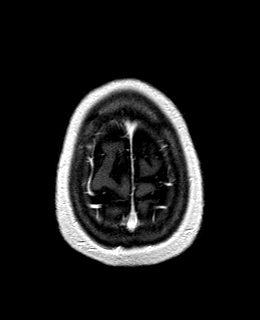
[im 144/144]
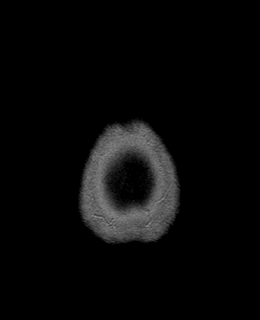

[Series 14: T1 post-contrast · coronal · 5.0mm · 0.72mm/px · 2 of 30 slices shown]
[im 1/30]
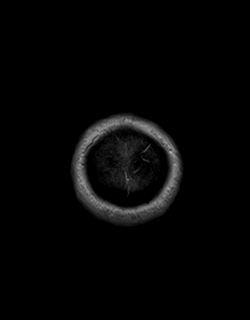
[im 30/30]
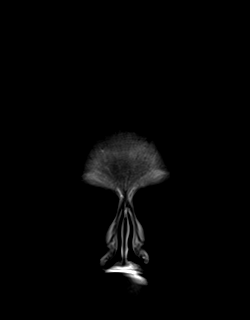

[48 of 48 positions shown; findings below may reference images not displayed]

FINDINGS: Brain: No evidence for acute infarction, hemorrhage, mass lesion,
hydrocephalus, or extra-axial fluid. Generalized atrophy. Moderately
advanced T2 and FLAIR hyperintensities in the supratentorial white
matter, also involving the brainstem, consistent with small vessel
disease.

Post infusion, no abnormal enhancement of the brain or meninges.

Vascular: Normal flow voids.

Skull and upper cervical spine: Normal marrow signal. Cervical
spondylosis.

Sinuses/Orbits: Negative.

Other: None
IMPRESSION: Atrophy. Moderately advanced small vessel disease. No acute
intracranial findings.

No evidence of acute, subacute, or chronic infarction to explain
expressive aphasia. No abnormal postcontrast enhancement is evident.

## 2020-01-12 DIAGNOSIS — I10 Essential (primary) hypertension: Secondary | ICD-10-CM | POA: Diagnosis not present

## 2020-01-12 DIAGNOSIS — E7800 Pure hypercholesterolemia, unspecified: Secondary | ICD-10-CM | POA: Diagnosis not present

## 2020-01-12 DIAGNOSIS — F0281 Dementia in other diseases classified elsewhere with behavioral disturbance: Secondary | ICD-10-CM | POA: Diagnosis not present

## 2020-01-12 DIAGNOSIS — G301 Alzheimer's disease with late onset: Secondary | ICD-10-CM | POA: Diagnosis not present

## 2020-02-28 DIAGNOSIS — G308 Other Alzheimer's disease: Secondary | ICD-10-CM | POA: Diagnosis not present

## 2020-02-28 DIAGNOSIS — F0280 Dementia in other diseases classified elsewhere without behavioral disturbance: Secondary | ICD-10-CM | POA: Diagnosis not present

## 2020-02-28 DIAGNOSIS — I10 Essential (primary) hypertension: Secondary | ICD-10-CM | POA: Diagnosis not present

## 2020-02-28 DIAGNOSIS — R4701 Aphasia: Secondary | ICD-10-CM | POA: Diagnosis not present

## 2020-02-28 DIAGNOSIS — E441 Mild protein-calorie malnutrition: Secondary | ICD-10-CM | POA: Diagnosis not present

## 2020-02-28 DIAGNOSIS — R5381 Other malaise: Secondary | ICD-10-CM | POA: Diagnosis not present

## 2020-02-29 DIAGNOSIS — E785 Hyperlipidemia, unspecified: Secondary | ICD-10-CM | POA: Diagnosis not present

## 2020-02-29 DIAGNOSIS — G309 Alzheimer's disease, unspecified: Secondary | ICD-10-CM | POA: Diagnosis not present

## 2020-02-29 DIAGNOSIS — E560 Deficiency of vitamin E: Secondary | ICD-10-CM | POA: Diagnosis not present

## 2020-02-29 DIAGNOSIS — F0280 Dementia in other diseases classified elsewhere without behavioral disturbance: Secondary | ICD-10-CM | POA: Diagnosis not present

## 2020-02-29 DIAGNOSIS — R4701 Aphasia: Secondary | ICD-10-CM | POA: Diagnosis not present

## 2020-02-29 DIAGNOSIS — Z9181 History of falling: Secondary | ICD-10-CM | POA: Diagnosis not present

## 2020-02-29 DIAGNOSIS — M1990 Unspecified osteoarthritis, unspecified site: Secondary | ICD-10-CM | POA: Diagnosis not present

## 2020-02-29 DIAGNOSIS — I129 Hypertensive chronic kidney disease with stage 1 through stage 4 chronic kidney disease, or unspecified chronic kidney disease: Secondary | ICD-10-CM | POA: Diagnosis not present

## 2020-02-29 DIAGNOSIS — N183 Chronic kidney disease, stage 3 unspecified: Secondary | ICD-10-CM | POA: Diagnosis not present

## 2020-02-29 DIAGNOSIS — E441 Mild protein-calorie malnutrition: Secondary | ICD-10-CM | POA: Diagnosis not present

## 2020-02-29 DIAGNOSIS — E7800 Pure hypercholesterolemia, unspecified: Secondary | ICD-10-CM | POA: Diagnosis not present

## 2020-03-06 DIAGNOSIS — R4701 Aphasia: Secondary | ICD-10-CM | POA: Diagnosis not present

## 2020-03-06 DIAGNOSIS — Z9181 History of falling: Secondary | ICD-10-CM | POA: Diagnosis not present

## 2020-03-06 DIAGNOSIS — M1990 Unspecified osteoarthritis, unspecified site: Secondary | ICD-10-CM | POA: Diagnosis not present

## 2020-03-06 DIAGNOSIS — I10 Essential (primary) hypertension: Secondary | ICD-10-CM | POA: Diagnosis not present

## 2020-03-06 DIAGNOSIS — G309 Alzheimer's disease, unspecified: Secondary | ICD-10-CM | POA: Diagnosis not present

## 2020-03-06 DIAGNOSIS — I129 Hypertensive chronic kidney disease with stage 1 through stage 4 chronic kidney disease, or unspecified chronic kidney disease: Secondary | ICD-10-CM | POA: Diagnosis not present

## 2020-03-06 DIAGNOSIS — E441 Mild protein-calorie malnutrition: Secondary | ICD-10-CM | POA: Diagnosis not present

## 2020-03-06 DIAGNOSIS — F0280 Dementia in other diseases classified elsewhere without behavioral disturbance: Secondary | ICD-10-CM | POA: Diagnosis not present

## 2020-03-06 DIAGNOSIS — E785 Hyperlipidemia, unspecified: Secondary | ICD-10-CM | POA: Diagnosis not present

## 2020-03-06 DIAGNOSIS — E7800 Pure hypercholesterolemia, unspecified: Secondary | ICD-10-CM | POA: Diagnosis not present

## 2020-03-06 DIAGNOSIS — N183 Chronic kidney disease, stage 3 unspecified: Secondary | ICD-10-CM | POA: Diagnosis not present

## 2020-03-06 DIAGNOSIS — E560 Deficiency of vitamin E: Secondary | ICD-10-CM | POA: Diagnosis not present

## 2020-03-06 DIAGNOSIS — F0281 Dementia in other diseases classified elsewhere with behavioral disturbance: Secondary | ICD-10-CM | POA: Diagnosis not present

## 2020-03-08 DIAGNOSIS — G309 Alzheimer's disease, unspecified: Secondary | ICD-10-CM | POA: Diagnosis not present

## 2020-03-13 DIAGNOSIS — G308 Other Alzheimer's disease: Secondary | ICD-10-CM | POA: Diagnosis not present

## 2020-03-13 DIAGNOSIS — F0281 Dementia in other diseases classified elsewhere with behavioral disturbance: Secondary | ICD-10-CM | POA: Diagnosis not present

## 2020-03-21 DIAGNOSIS — L89612 Pressure ulcer of right heel, stage 2: Secondary | ICD-10-CM | POA: Diagnosis not present

## 2020-03-21 DIAGNOSIS — L89622 Pressure ulcer of left heel, stage 2: Secondary | ICD-10-CM | POA: Diagnosis not present

## 2020-03-22 DIAGNOSIS — E560 Deficiency of vitamin E: Secondary | ICD-10-CM | POA: Diagnosis not present

## 2020-03-22 DIAGNOSIS — E7800 Pure hypercholesterolemia, unspecified: Secondary | ICD-10-CM | POA: Diagnosis not present

## 2020-03-22 DIAGNOSIS — G309 Alzheimer's disease, unspecified: Secondary | ICD-10-CM | POA: Diagnosis not present

## 2020-03-22 DIAGNOSIS — F0280 Dementia in other diseases classified elsewhere without behavioral disturbance: Secondary | ICD-10-CM | POA: Diagnosis not present

## 2020-03-22 DIAGNOSIS — E441 Mild protein-calorie malnutrition: Secondary | ICD-10-CM | POA: Diagnosis not present

## 2020-03-22 DIAGNOSIS — M1990 Unspecified osteoarthritis, unspecified site: Secondary | ICD-10-CM | POA: Diagnosis not present

## 2020-03-22 DIAGNOSIS — R4701 Aphasia: Secondary | ICD-10-CM | POA: Diagnosis not present

## 2020-03-22 DIAGNOSIS — I129 Hypertensive chronic kidney disease with stage 1 through stage 4 chronic kidney disease, or unspecified chronic kidney disease: Secondary | ICD-10-CM | POA: Diagnosis not present

## 2020-03-22 DIAGNOSIS — Z9181 History of falling: Secondary | ICD-10-CM | POA: Diagnosis not present

## 2020-03-22 DIAGNOSIS — N183 Chronic kidney disease, stage 3 unspecified: Secondary | ICD-10-CM | POA: Diagnosis not present

## 2020-03-22 DIAGNOSIS — E785 Hyperlipidemia, unspecified: Secondary | ICD-10-CM | POA: Diagnosis not present

## 2020-04-09 DIAGNOSIS — N390 Urinary tract infection, site not specified: Secondary | ICD-10-CM | POA: Diagnosis not present

## 2020-04-17 DIAGNOSIS — F0281 Dementia in other diseases classified elsewhere with behavioral disturbance: Secondary | ICD-10-CM | POA: Diagnosis not present

## 2020-04-17 DIAGNOSIS — G308 Other Alzheimer's disease: Secondary | ICD-10-CM | POA: Diagnosis not present

## 2020-05-01 DIAGNOSIS — E7800 Pure hypercholesterolemia, unspecified: Secondary | ICD-10-CM | POA: Diagnosis not present

## 2020-05-15 DIAGNOSIS — F0281 Dementia in other diseases classified elsewhere with behavioral disturbance: Secondary | ICD-10-CM | POA: Diagnosis not present

## 2020-05-15 DIAGNOSIS — G301 Alzheimer's disease with late onset: Secondary | ICD-10-CM | POA: Diagnosis not present

## 2020-05-15 DIAGNOSIS — F331 Major depressive disorder, recurrent, moderate: Secondary | ICD-10-CM | POA: Diagnosis not present

## 2020-05-17 DIAGNOSIS — N390 Urinary tract infection, site not specified: Secondary | ICD-10-CM | POA: Diagnosis not present

## 2020-05-29 DIAGNOSIS — G301 Alzheimer's disease with late onset: Secondary | ICD-10-CM | POA: Diagnosis not present

## 2020-05-29 DIAGNOSIS — F064 Anxiety disorder due to known physiological condition: Secondary | ICD-10-CM | POA: Diagnosis not present

## 2020-05-29 DIAGNOSIS — F0281 Dementia in other diseases classified elsewhere with behavioral disturbance: Secondary | ICD-10-CM | POA: Diagnosis not present

## 2020-05-29 DIAGNOSIS — F331 Major depressive disorder, recurrent, moderate: Secondary | ICD-10-CM | POA: Diagnosis not present

## 2020-05-31 DIAGNOSIS — F331 Major depressive disorder, recurrent, moderate: Secondary | ICD-10-CM | POA: Diagnosis not present

## 2020-06-12 DIAGNOSIS — F0281 Dementia in other diseases classified elsewhere with behavioral disturbance: Secondary | ICD-10-CM | POA: Diagnosis not present

## 2020-06-12 DIAGNOSIS — F331 Major depressive disorder, recurrent, moderate: Secondary | ICD-10-CM | POA: Diagnosis not present

## 2020-06-12 DIAGNOSIS — G301 Alzheimer's disease with late onset: Secondary | ICD-10-CM | POA: Diagnosis not present

## 2020-06-12 DIAGNOSIS — F064 Anxiety disorder due to known physiological condition: Secondary | ICD-10-CM | POA: Diagnosis not present

## 2020-07-17 DIAGNOSIS — F0281 Dementia in other diseases classified elsewhere with behavioral disturbance: Secondary | ICD-10-CM | POA: Diagnosis not present

## 2020-07-17 DIAGNOSIS — G301 Alzheimer's disease with late onset: Secondary | ICD-10-CM | POA: Diagnosis not present

## 2020-07-17 DIAGNOSIS — F331 Major depressive disorder, recurrent, moderate: Secondary | ICD-10-CM | POA: Diagnosis not present

## 2020-07-17 DIAGNOSIS — F064 Anxiety disorder due to known physiological condition: Secondary | ICD-10-CM | POA: Diagnosis not present

## 2020-07-31 DIAGNOSIS — F064 Anxiety disorder due to known physiological condition: Secondary | ICD-10-CM | POA: Diagnosis not present

## 2020-07-31 DIAGNOSIS — G301 Alzheimer's disease with late onset: Secondary | ICD-10-CM | POA: Diagnosis not present

## 2020-07-31 DIAGNOSIS — F331 Major depressive disorder, recurrent, moderate: Secondary | ICD-10-CM | POA: Diagnosis not present

## 2020-07-31 DIAGNOSIS — F0281 Dementia in other diseases classified elsewhere with behavioral disturbance: Secondary | ICD-10-CM | POA: Diagnosis not present

## 2020-08-28 DIAGNOSIS — F331 Major depressive disorder, recurrent, moderate: Secondary | ICD-10-CM | POA: Diagnosis not present

## 2020-08-28 DIAGNOSIS — F064 Anxiety disorder due to known physiological condition: Secondary | ICD-10-CM | POA: Diagnosis not present

## 2020-08-28 DIAGNOSIS — G301 Alzheimer's disease with late onset: Secondary | ICD-10-CM | POA: Diagnosis not present

## 2020-08-28 DIAGNOSIS — F0281 Dementia in other diseases classified elsewhere with behavioral disturbance: Secondary | ICD-10-CM | POA: Diagnosis not present

## 2020-09-06 DIAGNOSIS — E7800 Pure hypercholesterolemia, unspecified: Secondary | ICD-10-CM | POA: Diagnosis not present

## 2020-09-25 DIAGNOSIS — F0281 Dementia in other diseases classified elsewhere with behavioral disturbance: Secondary | ICD-10-CM | POA: Diagnosis not present

## 2020-09-25 DIAGNOSIS — F331 Major depressive disorder, recurrent, moderate: Secondary | ICD-10-CM | POA: Diagnosis not present

## 2020-09-25 DIAGNOSIS — G301 Alzheimer's disease with late onset: Secondary | ICD-10-CM | POA: Diagnosis not present

## 2020-09-25 DIAGNOSIS — F064 Anxiety disorder due to known physiological condition: Secondary | ICD-10-CM | POA: Diagnosis not present

## 2020-10-10 DIAGNOSIS — N390 Urinary tract infection, site not specified: Secondary | ICD-10-CM | POA: Diagnosis not present

## 2020-10-17 DIAGNOSIS — N390 Urinary tract infection, site not specified: Secondary | ICD-10-CM | POA: Diagnosis not present

## 2020-10-23 DIAGNOSIS — F331 Major depressive disorder, recurrent, moderate: Secondary | ICD-10-CM | POA: Diagnosis not present

## 2020-10-23 DIAGNOSIS — E039 Hypothyroidism, unspecified: Secondary | ICD-10-CM | POA: Diagnosis not present

## 2020-10-23 DIAGNOSIS — F064 Anxiety disorder due to known physiological condition: Secondary | ICD-10-CM | POA: Diagnosis not present

## 2020-10-23 DIAGNOSIS — G301 Alzheimer's disease with late onset: Secondary | ICD-10-CM | POA: Diagnosis not present

## 2020-10-23 DIAGNOSIS — F0281 Dementia in other diseases classified elsewhere with behavioral disturbance: Secondary | ICD-10-CM | POA: Diagnosis not present

## 2020-10-30 DIAGNOSIS — E038 Other specified hypothyroidism: Secondary | ICD-10-CM | POA: Diagnosis not present

## 2020-10-30 DIAGNOSIS — F0281 Dementia in other diseases classified elsewhere with behavioral disturbance: Secondary | ICD-10-CM | POA: Diagnosis not present

## 2020-11-20 DIAGNOSIS — F064 Anxiety disorder due to known physiological condition: Secondary | ICD-10-CM | POA: Diagnosis not present

## 2020-11-20 DIAGNOSIS — F0281 Dementia in other diseases classified elsewhere with behavioral disturbance: Secondary | ICD-10-CM | POA: Diagnosis not present

## 2020-11-20 DIAGNOSIS — G301 Alzheimer's disease with late onset: Secondary | ICD-10-CM | POA: Diagnosis not present

## 2020-11-20 DIAGNOSIS — F331 Major depressive disorder, recurrent, moderate: Secondary | ICD-10-CM | POA: Diagnosis not present

## 2020-12-10 DIAGNOSIS — E039 Hypothyroidism, unspecified: Secondary | ICD-10-CM | POA: Diagnosis not present

## 2020-12-18 DIAGNOSIS — F331 Major depressive disorder, recurrent, moderate: Secondary | ICD-10-CM | POA: Diagnosis not present

## 2020-12-18 DIAGNOSIS — F0281 Dementia in other diseases classified elsewhere with behavioral disturbance: Secondary | ICD-10-CM | POA: Diagnosis not present

## 2020-12-18 DIAGNOSIS — F064 Anxiety disorder due to known physiological condition: Secondary | ICD-10-CM | POA: Diagnosis not present

## 2020-12-18 DIAGNOSIS — G301 Alzheimer's disease with late onset: Secondary | ICD-10-CM | POA: Diagnosis not present

## 2020-12-25 DIAGNOSIS — F0280 Dementia in other diseases classified elsewhere without behavioral disturbance: Secondary | ICD-10-CM | POA: Diagnosis not present

## 2020-12-25 DIAGNOSIS — B3789 Other sites of candidiasis: Secondary | ICD-10-CM | POA: Diagnosis not present

## 2020-12-25 DIAGNOSIS — G308 Other Alzheimer's disease: Secondary | ICD-10-CM | POA: Diagnosis not present

## 2020-12-25 DIAGNOSIS — L710 Perioral dermatitis: Secondary | ICD-10-CM | POA: Diagnosis not present

## 2021-01-15 DIAGNOSIS — G301 Alzheimer's disease with late onset: Secondary | ICD-10-CM | POA: Diagnosis not present

## 2021-01-15 DIAGNOSIS — F064 Anxiety disorder due to known physiological condition: Secondary | ICD-10-CM | POA: Diagnosis not present

## 2021-01-15 DIAGNOSIS — F331 Major depressive disorder, recurrent, moderate: Secondary | ICD-10-CM | POA: Diagnosis not present

## 2021-01-15 DIAGNOSIS — F0281 Dementia in other diseases classified elsewhere with behavioral disturbance: Secondary | ICD-10-CM | POA: Diagnosis not present

## 2021-01-29 DIAGNOSIS — F0281 Dementia in other diseases classified elsewhere with behavioral disturbance: Secondary | ICD-10-CM | POA: Diagnosis not present

## 2021-01-29 DIAGNOSIS — F331 Major depressive disorder, recurrent, moderate: Secondary | ICD-10-CM | POA: Diagnosis not present

## 2021-01-29 DIAGNOSIS — G301 Alzheimer's disease with late onset: Secondary | ICD-10-CM | POA: Diagnosis not present

## 2021-01-29 DIAGNOSIS — F064 Anxiety disorder due to known physiological condition: Secondary | ICD-10-CM | POA: Diagnosis not present

## 2021-02-12 DIAGNOSIS — F0281 Dementia in other diseases classified elsewhere with behavioral disturbance: Secondary | ICD-10-CM | POA: Diagnosis not present

## 2021-02-12 DIAGNOSIS — F331 Major depressive disorder, recurrent, moderate: Secondary | ICD-10-CM | POA: Diagnosis not present

## 2021-02-12 DIAGNOSIS — G301 Alzheimer's disease with late onset: Secondary | ICD-10-CM | POA: Diagnosis not present

## 2021-02-12 DIAGNOSIS — F064 Anxiety disorder due to known physiological condition: Secondary | ICD-10-CM | POA: Diagnosis not present

## 2021-02-26 DIAGNOSIS — E7849 Other hyperlipidemia: Secondary | ICD-10-CM | POA: Diagnosis not present

## 2021-02-26 DIAGNOSIS — E038 Other specified hypothyroidism: Secondary | ICD-10-CM | POA: Diagnosis not present

## 2021-02-26 DIAGNOSIS — R4701 Aphasia: Secondary | ICD-10-CM | POA: Diagnosis not present

## 2021-02-26 DIAGNOSIS — B3789 Other sites of candidiasis: Secondary | ICD-10-CM | POA: Diagnosis not present

## 2021-02-26 DIAGNOSIS — I10 Essential (primary) hypertension: Secondary | ICD-10-CM | POA: Diagnosis not present

## 2021-02-26 DIAGNOSIS — L710 Perioral dermatitis: Secondary | ICD-10-CM | POA: Diagnosis not present

## 2021-02-26 DIAGNOSIS — F418 Other specified anxiety disorders: Secondary | ICD-10-CM | POA: Diagnosis not present

## 2021-02-26 DIAGNOSIS — F0280 Dementia in other diseases classified elsewhere without behavioral disturbance: Secondary | ICD-10-CM | POA: Diagnosis not present

## 2021-03-07 DIAGNOSIS — E7800 Pure hypercholesterolemia, unspecified: Secondary | ICD-10-CM | POA: Diagnosis not present

## 2021-03-07 DIAGNOSIS — E441 Mild protein-calorie malnutrition: Secondary | ICD-10-CM | POA: Diagnosis not present

## 2021-03-07 DIAGNOSIS — I10 Essential (primary) hypertension: Secondary | ICD-10-CM | POA: Diagnosis not present

## 2021-03-12 DIAGNOSIS — G301 Alzheimer's disease with late onset: Secondary | ICD-10-CM | POA: Diagnosis not present

## 2021-03-12 DIAGNOSIS — F331 Major depressive disorder, recurrent, moderate: Secondary | ICD-10-CM | POA: Diagnosis not present

## 2021-03-12 DIAGNOSIS — F0281 Dementia in other diseases classified elsewhere with behavioral disturbance: Secondary | ICD-10-CM | POA: Diagnosis not present

## 2021-03-12 DIAGNOSIS — F064 Anxiety disorder due to known physiological condition: Secondary | ICD-10-CM | POA: Diagnosis not present

## 2021-03-22 DIAGNOSIS — R0902 Hypoxemia: Secondary | ICD-10-CM | POA: Diagnosis not present

## 2021-03-22 DIAGNOSIS — R55 Syncope and collapse: Secondary | ICD-10-CM | POA: Diagnosis not present

## 2021-03-22 DIAGNOSIS — G319 Degenerative disease of nervous system, unspecified: Secondary | ICD-10-CM | POA: Diagnosis not present

## 2021-03-22 DIAGNOSIS — Y998 Other external cause status: Secondary | ICD-10-CM | POA: Diagnosis not present

## 2021-03-22 DIAGNOSIS — M4312 Spondylolisthesis, cervical region: Secondary | ICD-10-CM | POA: Diagnosis not present

## 2021-03-22 DIAGNOSIS — R404 Transient alteration of awareness: Secondary | ICD-10-CM | POA: Diagnosis not present

## 2021-03-22 DIAGNOSIS — Z043 Encounter for examination and observation following other accident: Secondary | ICD-10-CM | POA: Diagnosis not present

## 2021-03-22 DIAGNOSIS — M4602 Spinal enthesopathy, cervical region: Secondary | ICD-10-CM | POA: Diagnosis not present

## 2021-03-22 DIAGNOSIS — W228XXA Striking against or struck by other objects, initial encounter: Secondary | ICD-10-CM | POA: Diagnosis not present

## 2021-03-22 DIAGNOSIS — W19XXXA Unspecified fall, initial encounter: Secondary | ICD-10-CM | POA: Diagnosis not present

## 2021-03-27 DIAGNOSIS — F0390 Unspecified dementia without behavioral disturbance: Secondary | ICD-10-CM | POA: Diagnosis not present

## 2021-03-27 DIAGNOSIS — R55 Syncope and collapse: Secondary | ICD-10-CM | POA: Diagnosis not present

## 2021-03-27 DIAGNOSIS — W1789XA Other fall from one level to another, initial encounter: Secondary | ICD-10-CM | POA: Diagnosis not present

## 2021-03-27 DIAGNOSIS — R5381 Other malaise: Secondary | ICD-10-CM | POA: Diagnosis not present

## 2021-04-04 DIAGNOSIS — E7800 Pure hypercholesterolemia, unspecified: Secondary | ICD-10-CM | POA: Diagnosis not present

## 2021-04-09 DIAGNOSIS — F064 Anxiety disorder due to known physiological condition: Secondary | ICD-10-CM | POA: Diagnosis not present

## 2021-04-09 DIAGNOSIS — E7800 Pure hypercholesterolemia, unspecified: Secondary | ICD-10-CM | POA: Diagnosis not present

## 2021-04-09 DIAGNOSIS — R5381 Other malaise: Secondary | ICD-10-CM | POA: Diagnosis not present

## 2021-04-09 DIAGNOSIS — F0281 Dementia in other diseases classified elsewhere with behavioral disturbance: Secondary | ICD-10-CM | POA: Diagnosis not present

## 2021-04-09 DIAGNOSIS — G301 Alzheimer's disease with late onset: Secondary | ICD-10-CM | POA: Diagnosis not present

## 2021-04-09 DIAGNOSIS — F331 Major depressive disorder, recurrent, moderate: Secondary | ICD-10-CM | POA: Diagnosis not present

## 2021-05-07 DIAGNOSIS — F0281 Dementia in other diseases classified elsewhere with behavioral disturbance: Secondary | ICD-10-CM | POA: Diagnosis not present

## 2021-05-07 DIAGNOSIS — F064 Anxiety disorder due to known physiological condition: Secondary | ICD-10-CM | POA: Diagnosis not present

## 2021-05-07 DIAGNOSIS — F331 Major depressive disorder, recurrent, moderate: Secondary | ICD-10-CM | POA: Diagnosis not present

## 2021-05-07 DIAGNOSIS — G301 Alzheimer's disease with late onset: Secondary | ICD-10-CM | POA: Diagnosis not present

## 2021-06-04 DIAGNOSIS — F331 Major depressive disorder, recurrent, moderate: Secondary | ICD-10-CM | POA: Diagnosis not present

## 2021-06-04 DIAGNOSIS — F0281 Dementia in other diseases classified elsewhere with behavioral disturbance: Secondary | ICD-10-CM | POA: Diagnosis not present

## 2021-06-04 DIAGNOSIS — F064 Anxiety disorder due to known physiological condition: Secondary | ICD-10-CM | POA: Diagnosis not present

## 2021-06-04 DIAGNOSIS — G301 Alzheimer's disease with late onset: Secondary | ICD-10-CM | POA: Diagnosis not present

## 2021-06-11 DIAGNOSIS — E039 Hypothyroidism, unspecified: Secondary | ICD-10-CM | POA: Diagnosis not present

## 2021-06-19 DIAGNOSIS — F0391 Unspecified dementia with behavioral disturbance: Secondary | ICD-10-CM | POA: Diagnosis not present

## 2021-06-19 DIAGNOSIS — R5383 Other fatigue: Secondary | ICD-10-CM | POA: Diagnosis not present

## 2021-06-26 DIAGNOSIS — A0471 Enterocolitis due to Clostridium difficile, recurrent: Secondary | ICD-10-CM | POA: Diagnosis not present

## 2021-07-02 DIAGNOSIS — F0281 Dementia in other diseases classified elsewhere with behavioral disturbance: Secondary | ICD-10-CM | POA: Diagnosis not present

## 2021-07-02 DIAGNOSIS — F331 Major depressive disorder, recurrent, moderate: Secondary | ICD-10-CM | POA: Diagnosis not present

## 2021-07-02 DIAGNOSIS — F064 Anxiety disorder due to known physiological condition: Secondary | ICD-10-CM | POA: Diagnosis not present

## 2021-07-02 DIAGNOSIS — G301 Alzheimer's disease with late onset: Secondary | ICD-10-CM | POA: Diagnosis not present

## 2021-07-15 DIAGNOSIS — F331 Major depressive disorder, recurrent, moderate: Secondary | ICD-10-CM | POA: Diagnosis not present

## 2021-07-30 DIAGNOSIS — F0281 Dementia in other diseases classified elsewhere with behavioral disturbance: Secondary | ICD-10-CM | POA: Diagnosis not present

## 2021-07-30 DIAGNOSIS — G301 Alzheimer's disease with late onset: Secondary | ICD-10-CM | POA: Diagnosis not present

## 2021-07-30 DIAGNOSIS — F064 Anxiety disorder due to known physiological condition: Secondary | ICD-10-CM | POA: Diagnosis not present

## 2021-07-30 DIAGNOSIS — F331 Major depressive disorder, recurrent, moderate: Secondary | ICD-10-CM | POA: Diagnosis not present

## 2021-08-01 DIAGNOSIS — R5381 Other malaise: Secondary | ICD-10-CM | POA: Diagnosis not present

## 2021-08-01 DIAGNOSIS — R5383 Other fatigue: Secondary | ICD-10-CM | POA: Diagnosis not present

## 2021-08-01 DIAGNOSIS — L710 Perioral dermatitis: Secondary | ICD-10-CM | POA: Diagnosis not present

## 2021-08-01 DIAGNOSIS — F0390 Unspecified dementia without behavioral disturbance: Secondary | ICD-10-CM | POA: Diagnosis not present

## 2021-08-05 DIAGNOSIS — F0280 Dementia in other diseases classified elsewhere without behavioral disturbance: Secondary | ICD-10-CM | POA: Diagnosis not present

## 2021-08-05 DIAGNOSIS — E441 Mild protein-calorie malnutrition: Secondary | ICD-10-CM | POA: Diagnosis not present

## 2021-08-05 DIAGNOSIS — M47812 Spondylosis without myelopathy or radiculopathy, cervical region: Secondary | ICD-10-CM | POA: Diagnosis not present

## 2021-08-05 DIAGNOSIS — G8929 Other chronic pain: Secondary | ICD-10-CM | POA: Diagnosis not present

## 2021-08-05 DIAGNOSIS — Z951 Presence of aortocoronary bypass graft: Secondary | ICD-10-CM | POA: Diagnosis not present

## 2021-08-05 DIAGNOSIS — M810 Age-related osteoporosis without current pathological fracture: Secondary | ICD-10-CM | POA: Diagnosis not present

## 2021-08-05 DIAGNOSIS — R4701 Aphasia: Secondary | ICD-10-CM | POA: Diagnosis not present

## 2021-08-05 DIAGNOSIS — Z950 Presence of cardiac pacemaker: Secondary | ICD-10-CM | POA: Diagnosis not present

## 2021-08-05 DIAGNOSIS — H9190 Unspecified hearing loss, unspecified ear: Secondary | ICD-10-CM | POA: Diagnosis not present

## 2021-08-05 DIAGNOSIS — F419 Anxiety disorder, unspecified: Secondary | ICD-10-CM | POA: Diagnosis not present

## 2021-08-05 DIAGNOSIS — M47814 Spondylosis without myelopathy or radiculopathy, thoracic region: Secondary | ICD-10-CM | POA: Diagnosis not present

## 2021-08-05 DIAGNOSIS — H548 Legal blindness, as defined in USA: Secondary | ICD-10-CM | POA: Diagnosis not present

## 2021-08-05 DIAGNOSIS — L710 Perioral dermatitis: Secondary | ICD-10-CM | POA: Diagnosis not present

## 2021-08-05 DIAGNOSIS — Z9181 History of falling: Secondary | ICD-10-CM | POA: Diagnosis not present

## 2021-08-05 DIAGNOSIS — G309 Alzheimer's disease, unspecified: Secondary | ICD-10-CM | POA: Diagnosis not present

## 2021-08-05 DIAGNOSIS — M19011 Primary osteoarthritis, right shoulder: Secondary | ICD-10-CM | POA: Diagnosis not present

## 2021-08-05 DIAGNOSIS — I10 Essential (primary) hypertension: Secondary | ICD-10-CM | POA: Diagnosis not present

## 2021-08-05 DIAGNOSIS — E7800 Pure hypercholesterolemia, unspecified: Secondary | ICD-10-CM | POA: Diagnosis not present

## 2021-08-05 DIAGNOSIS — F329 Major depressive disorder, single episode, unspecified: Secondary | ICD-10-CM | POA: Diagnosis not present

## 2021-08-08 DIAGNOSIS — N939 Abnormal uterine and vaginal bleeding, unspecified: Secondary | ICD-10-CM | POA: Diagnosis not present

## 2021-08-08 DIAGNOSIS — F0390 Unspecified dementia without behavioral disturbance: Secondary | ICD-10-CM | POA: Diagnosis not present

## 2021-08-08 DIAGNOSIS — L710 Perioral dermatitis: Secondary | ICD-10-CM | POA: Diagnosis not present

## 2021-08-12 DIAGNOSIS — N390 Urinary tract infection, site not specified: Secondary | ICD-10-CM | POA: Diagnosis not present

## 2021-08-13 DIAGNOSIS — F0390 Unspecified dementia without behavioral disturbance: Secondary | ICD-10-CM | POA: Diagnosis not present

## 2021-08-13 DIAGNOSIS — N939 Abnormal uterine and vaginal bleeding, unspecified: Secondary | ICD-10-CM | POA: Diagnosis not present

## 2021-08-15 DIAGNOSIS — E785 Hyperlipidemia, unspecified: Secondary | ICD-10-CM | POA: Diagnosis not present

## 2021-08-15 DIAGNOSIS — N952 Postmenopausal atrophic vaginitis: Secondary | ICD-10-CM | POA: Diagnosis not present

## 2021-08-15 DIAGNOSIS — N904 Leukoplakia of vulva: Secondary | ICD-10-CM | POA: Diagnosis not present

## 2021-08-15 DIAGNOSIS — N950 Postmenopausal bleeding: Secondary | ICD-10-CM | POA: Diagnosis not present

## 2021-08-16 DIAGNOSIS — N950 Postmenopausal bleeding: Secondary | ICD-10-CM | POA: Diagnosis not present

## 2021-08-19 DIAGNOSIS — R4701 Aphasia: Secondary | ICD-10-CM | POA: Diagnosis not present

## 2021-08-19 DIAGNOSIS — E441 Mild protein-calorie malnutrition: Secondary | ICD-10-CM | POA: Diagnosis not present

## 2021-08-19 DIAGNOSIS — I10 Essential (primary) hypertension: Secondary | ICD-10-CM | POA: Diagnosis not present

## 2021-08-19 DIAGNOSIS — Z951 Presence of aortocoronary bypass graft: Secondary | ICD-10-CM | POA: Diagnosis not present

## 2021-08-19 DIAGNOSIS — H9190 Unspecified hearing loss, unspecified ear: Secondary | ICD-10-CM | POA: Diagnosis not present

## 2021-08-19 DIAGNOSIS — M47814 Spondylosis without myelopathy or radiculopathy, thoracic region: Secondary | ICD-10-CM | POA: Diagnosis not present

## 2021-08-19 DIAGNOSIS — M47812 Spondylosis without myelopathy or radiculopathy, cervical region: Secondary | ICD-10-CM | POA: Diagnosis not present

## 2021-08-19 DIAGNOSIS — H548 Legal blindness, as defined in USA: Secondary | ICD-10-CM | POA: Diagnosis not present

## 2021-08-19 DIAGNOSIS — F329 Major depressive disorder, single episode, unspecified: Secondary | ICD-10-CM | POA: Diagnosis not present

## 2021-08-19 DIAGNOSIS — Z9181 History of falling: Secondary | ICD-10-CM | POA: Diagnosis not present

## 2021-08-19 DIAGNOSIS — L710 Perioral dermatitis: Secondary | ICD-10-CM | POA: Diagnosis not present

## 2021-08-19 DIAGNOSIS — E7800 Pure hypercholesterolemia, unspecified: Secondary | ICD-10-CM | POA: Diagnosis not present

## 2021-08-19 DIAGNOSIS — G8929 Other chronic pain: Secondary | ICD-10-CM | POA: Diagnosis not present

## 2021-08-19 DIAGNOSIS — Z950 Presence of cardiac pacemaker: Secondary | ICD-10-CM | POA: Diagnosis not present

## 2021-08-19 DIAGNOSIS — F419 Anxiety disorder, unspecified: Secondary | ICD-10-CM | POA: Diagnosis not present

## 2021-08-19 DIAGNOSIS — M810 Age-related osteoporosis without current pathological fracture: Secondary | ICD-10-CM | POA: Diagnosis not present

## 2021-08-19 DIAGNOSIS — F0280 Dementia in other diseases classified elsewhere without behavioral disturbance: Secondary | ICD-10-CM | POA: Diagnosis not present

## 2021-08-19 DIAGNOSIS — M19011 Primary osteoarthritis, right shoulder: Secondary | ICD-10-CM | POA: Diagnosis not present

## 2021-08-19 DIAGNOSIS — G309 Alzheimer's disease, unspecified: Secondary | ICD-10-CM | POA: Diagnosis not present

## 2021-08-23 DIAGNOSIS — N390 Urinary tract infection, site not specified: Secondary | ICD-10-CM | POA: Diagnosis not present

## 2021-08-23 DIAGNOSIS — N950 Postmenopausal bleeding: Secondary | ICD-10-CM | POA: Diagnosis not present

## 2021-08-23 DIAGNOSIS — L280 Lichen simplex chronicus: Secondary | ICD-10-CM | POA: Diagnosis not present

## 2021-08-27 DIAGNOSIS — F064 Anxiety disorder due to known physiological condition: Secondary | ICD-10-CM | POA: Diagnosis not present

## 2021-08-27 DIAGNOSIS — F331 Major depressive disorder, recurrent, moderate: Secondary | ICD-10-CM | POA: Diagnosis not present

## 2021-08-27 DIAGNOSIS — G301 Alzheimer's disease with late onset: Secondary | ICD-10-CM | POA: Diagnosis not present

## 2021-09-03 DIAGNOSIS — F0390 Unspecified dementia without behavioral disturbance: Secondary | ICD-10-CM | POA: Diagnosis not present

## 2021-09-03 DIAGNOSIS — L710 Perioral dermatitis: Secondary | ICD-10-CM | POA: Diagnosis not present

## 2021-09-03 DIAGNOSIS — R8290 Unspecified abnormal findings in urine: Secondary | ICD-10-CM | POA: Diagnosis not present

## 2021-09-05 DIAGNOSIS — I10 Essential (primary) hypertension: Secondary | ICD-10-CM | POA: Diagnosis not present

## 2021-09-05 DIAGNOSIS — E039 Hypothyroidism, unspecified: Secondary | ICD-10-CM | POA: Diagnosis not present

## 2021-09-05 DIAGNOSIS — D649 Anemia, unspecified: Secondary | ICD-10-CM | POA: Diagnosis not present

## 2021-09-06 DIAGNOSIS — G309 Alzheimer's disease, unspecified: Secondary | ICD-10-CM | POA: Diagnosis not present

## 2021-09-06 DIAGNOSIS — F419 Anxiety disorder, unspecified: Secondary | ICD-10-CM | POA: Diagnosis not present

## 2021-09-06 DIAGNOSIS — H9190 Unspecified hearing loss, unspecified ear: Secondary | ICD-10-CM | POA: Diagnosis not present

## 2021-09-06 DIAGNOSIS — G8929 Other chronic pain: Secondary | ICD-10-CM | POA: Diagnosis not present

## 2021-09-06 DIAGNOSIS — L710 Perioral dermatitis: Secondary | ICD-10-CM | POA: Diagnosis not present

## 2021-09-06 DIAGNOSIS — E7800 Pure hypercholesterolemia, unspecified: Secondary | ICD-10-CM | POA: Diagnosis not present

## 2021-09-06 DIAGNOSIS — M47812 Spondylosis without myelopathy or radiculopathy, cervical region: Secondary | ICD-10-CM | POA: Diagnosis not present

## 2021-09-06 DIAGNOSIS — M19011 Primary osteoarthritis, right shoulder: Secondary | ICD-10-CM | POA: Diagnosis not present

## 2021-09-06 DIAGNOSIS — Z9181 History of falling: Secondary | ICD-10-CM | POA: Diagnosis not present

## 2021-09-06 DIAGNOSIS — N390 Urinary tract infection, site not specified: Secondary | ICD-10-CM | POA: Diagnosis not present

## 2021-09-06 DIAGNOSIS — F329 Major depressive disorder, single episode, unspecified: Secondary | ICD-10-CM | POA: Diagnosis not present

## 2021-09-06 DIAGNOSIS — M47814 Spondylosis without myelopathy or radiculopathy, thoracic region: Secondary | ICD-10-CM | POA: Diagnosis not present

## 2021-09-06 DIAGNOSIS — F0280 Dementia in other diseases classified elsewhere without behavioral disturbance: Secondary | ICD-10-CM | POA: Diagnosis not present

## 2021-09-06 DIAGNOSIS — Z951 Presence of aortocoronary bypass graft: Secondary | ICD-10-CM | POA: Diagnosis not present

## 2021-09-06 DIAGNOSIS — R4701 Aphasia: Secondary | ICD-10-CM | POA: Diagnosis not present

## 2021-09-06 DIAGNOSIS — E441 Mild protein-calorie malnutrition: Secondary | ICD-10-CM | POA: Diagnosis not present

## 2021-09-06 DIAGNOSIS — Z950 Presence of cardiac pacemaker: Secondary | ICD-10-CM | POA: Diagnosis not present

## 2021-09-06 DIAGNOSIS — M810 Age-related osteoporosis without current pathological fracture: Secondary | ICD-10-CM | POA: Diagnosis not present

## 2021-09-06 DIAGNOSIS — I10 Essential (primary) hypertension: Secondary | ICD-10-CM | POA: Diagnosis not present

## 2021-09-06 DIAGNOSIS — H548 Legal blindness, as defined in USA: Secondary | ICD-10-CM | POA: Diagnosis not present

## 2021-09-10 DIAGNOSIS — L309 Dermatitis, unspecified: Secondary | ICD-10-CM | POA: Diagnosis not present

## 2021-09-10 DIAGNOSIS — N390 Urinary tract infection, site not specified: Secondary | ICD-10-CM | POA: Diagnosis not present

## 2021-09-10 DIAGNOSIS — B9620 Unspecified Escherichia coli [E. coli] as the cause of diseases classified elsewhere: Secondary | ICD-10-CM | POA: Diagnosis not present

## 2021-09-13 DIAGNOSIS — T148XXA Other injury of unspecified body region, initial encounter: Secondary | ICD-10-CM | POA: Diagnosis not present

## 2021-09-13 DIAGNOSIS — S022XXA Fracture of nasal bones, initial encounter for closed fracture: Secondary | ICD-10-CM | POA: Diagnosis not present

## 2021-09-13 DIAGNOSIS — R279 Unspecified lack of coordination: Secondary | ICD-10-CM | POA: Diagnosis not present

## 2021-09-13 DIAGNOSIS — Y998 Other external cause status: Secondary | ICD-10-CM | POA: Diagnosis not present

## 2021-09-13 DIAGNOSIS — M47812 Spondylosis without myelopathy or radiculopathy, cervical region: Secondary | ICD-10-CM | POA: Diagnosis not present

## 2021-09-13 DIAGNOSIS — M4312 Spondylolisthesis, cervical region: Secondary | ICD-10-CM | POA: Diagnosis not present

## 2021-09-13 DIAGNOSIS — Z743 Need for continuous supervision: Secondary | ICD-10-CM | POA: Diagnosis not present

## 2021-09-13 DIAGNOSIS — M4802 Spinal stenosis, cervical region: Secondary | ICD-10-CM | POA: Diagnosis not present

## 2021-09-13 DIAGNOSIS — W010XXA Fall on same level from slipping, tripping and stumbling without subsequent striking against object, initial encounter: Secondary | ICD-10-CM | POA: Diagnosis not present

## 2021-09-13 DIAGNOSIS — Z043 Encounter for examination and observation following other accident: Secondary | ICD-10-CM | POA: Diagnosis not present

## 2021-09-13 DIAGNOSIS — R4182 Altered mental status, unspecified: Secondary | ICD-10-CM | POA: Diagnosis not present

## 2021-09-16 DIAGNOSIS — G309 Alzheimer's disease, unspecified: Secondary | ICD-10-CM | POA: Diagnosis not present

## 2021-09-16 DIAGNOSIS — L710 Perioral dermatitis: Secondary | ICD-10-CM | POA: Diagnosis not present

## 2021-09-16 DIAGNOSIS — F0280 Dementia in other diseases classified elsewhere without behavioral disturbance: Secondary | ICD-10-CM | POA: Diagnosis not present

## 2021-09-16 DIAGNOSIS — Z9181 History of falling: Secondary | ICD-10-CM | POA: Diagnosis not present

## 2021-09-16 DIAGNOSIS — E441 Mild protein-calorie malnutrition: Secondary | ICD-10-CM | POA: Diagnosis not present

## 2021-09-16 DIAGNOSIS — R4701 Aphasia: Secondary | ICD-10-CM | POA: Diagnosis not present

## 2021-09-16 DIAGNOSIS — G8929 Other chronic pain: Secondary | ICD-10-CM | POA: Diagnosis not present

## 2021-09-16 DIAGNOSIS — M47812 Spondylosis without myelopathy or radiculopathy, cervical region: Secondary | ICD-10-CM | POA: Diagnosis not present

## 2021-09-16 DIAGNOSIS — H9190 Unspecified hearing loss, unspecified ear: Secondary | ICD-10-CM | POA: Diagnosis not present

## 2021-09-16 DIAGNOSIS — F419 Anxiety disorder, unspecified: Secondary | ICD-10-CM | POA: Diagnosis not present

## 2021-09-16 DIAGNOSIS — Z950 Presence of cardiac pacemaker: Secondary | ICD-10-CM | POA: Diagnosis not present

## 2021-09-16 DIAGNOSIS — M810 Age-related osteoporosis without current pathological fracture: Secondary | ICD-10-CM | POA: Diagnosis not present

## 2021-09-16 DIAGNOSIS — M19011 Primary osteoarthritis, right shoulder: Secondary | ICD-10-CM | POA: Diagnosis not present

## 2021-09-16 DIAGNOSIS — I10 Essential (primary) hypertension: Secondary | ICD-10-CM | POA: Diagnosis not present

## 2021-09-16 DIAGNOSIS — E7800 Pure hypercholesterolemia, unspecified: Secondary | ICD-10-CM | POA: Diagnosis not present

## 2021-09-16 DIAGNOSIS — F329 Major depressive disorder, single episode, unspecified: Secondary | ICD-10-CM | POA: Diagnosis not present

## 2021-09-16 DIAGNOSIS — M47814 Spondylosis without myelopathy or radiculopathy, thoracic region: Secondary | ICD-10-CM | POA: Diagnosis not present

## 2021-09-16 DIAGNOSIS — Z951 Presence of aortocoronary bypass graft: Secondary | ICD-10-CM | POA: Diagnosis not present

## 2021-09-16 DIAGNOSIS — H548 Legal blindness, as defined in USA: Secondary | ICD-10-CM | POA: Diagnosis not present

## 2021-09-20 DIAGNOSIS — F0390 Unspecified dementia without behavioral disturbance: Secondary | ICD-10-CM | POA: Diagnosis not present

## 2021-09-20 DIAGNOSIS — S022XXA Fracture of nasal bones, initial encounter for closed fracture: Secondary | ICD-10-CM | POA: Diagnosis not present

## 2021-09-20 DIAGNOSIS — W19XXXA Unspecified fall, initial encounter: Secondary | ICD-10-CM | POA: Diagnosis not present

## 2021-09-24 DIAGNOSIS — G301 Alzheimer's disease with late onset: Secondary | ICD-10-CM | POA: Diagnosis not present

## 2021-09-24 DIAGNOSIS — F064 Anxiety disorder due to known physiological condition: Secondary | ICD-10-CM | POA: Diagnosis not present

## 2021-09-24 DIAGNOSIS — F331 Major depressive disorder, recurrent, moderate: Secondary | ICD-10-CM | POA: Diagnosis not present

## 2021-10-03 DIAGNOSIS — L710 Perioral dermatitis: Secondary | ICD-10-CM | POA: Diagnosis not present

## 2021-10-08 DIAGNOSIS — K08409 Partial loss of teeth, unspecified cause, unspecified class: Secondary | ICD-10-CM | POA: Diagnosis not present

## 2021-10-08 DIAGNOSIS — L710 Perioral dermatitis: Secondary | ICD-10-CM | POA: Diagnosis not present

## 2021-10-08 DIAGNOSIS — F03918 Unspecified dementia, unspecified severity, with other behavioral disturbance: Secondary | ICD-10-CM | POA: Diagnosis not present

## 2021-10-08 DIAGNOSIS — F0390 Unspecified dementia without behavioral disturbance: Secondary | ICD-10-CM | POA: Diagnosis not present

## 2021-10-22 DIAGNOSIS — G301 Alzheimer's disease with late onset: Secondary | ICD-10-CM | POA: Diagnosis not present

## 2021-10-22 DIAGNOSIS — F064 Anxiety disorder due to known physiological condition: Secondary | ICD-10-CM | POA: Diagnosis not present

## 2021-10-22 DIAGNOSIS — K08409 Partial loss of teeth, unspecified cause, unspecified class: Secondary | ICD-10-CM | POA: Diagnosis not present

## 2021-10-22 DIAGNOSIS — N950 Postmenopausal bleeding: Secondary | ICD-10-CM | POA: Diagnosis not present

## 2021-10-22 DIAGNOSIS — L710 Perioral dermatitis: Secondary | ICD-10-CM | POA: Diagnosis not present

## 2021-10-22 DIAGNOSIS — L280 Lichen simplex chronicus: Secondary | ICD-10-CM | POA: Diagnosis not present

## 2021-10-22 DIAGNOSIS — F331 Major depressive disorder, recurrent, moderate: Secondary | ICD-10-CM | POA: Diagnosis not present

## 2021-11-19 DIAGNOSIS — F064 Anxiety disorder due to known physiological condition: Secondary | ICD-10-CM | POA: Diagnosis not present

## 2021-11-19 DIAGNOSIS — F331 Major depressive disorder, recurrent, moderate: Secondary | ICD-10-CM | POA: Diagnosis not present

## 2021-11-19 DIAGNOSIS — G301 Alzheimer's disease with late onset: Secondary | ICD-10-CM | POA: Diagnosis not present

## 2021-11-26 DIAGNOSIS — F418 Other specified anxiety disorders: Secondary | ICD-10-CM | POA: Diagnosis not present

## 2021-11-26 DIAGNOSIS — F0390 Unspecified dementia without behavioral disturbance: Secondary | ICD-10-CM | POA: Diagnosis not present

## 2021-11-26 DIAGNOSIS — Z79899 Other long term (current) drug therapy: Secondary | ICD-10-CM | POA: Diagnosis not present

## 2021-12-11 DIAGNOSIS — R1310 Dysphagia, unspecified: Secondary | ICD-10-CM | POA: Diagnosis not present

## 2021-12-13 DIAGNOSIS — Z792 Long term (current) use of antibiotics: Secondary | ICD-10-CM | POA: Diagnosis not present

## 2021-12-13 DIAGNOSIS — Z9181 History of falling: Secondary | ICD-10-CM | POA: Diagnosis not present

## 2021-12-13 DIAGNOSIS — I10 Essential (primary) hypertension: Secondary | ICD-10-CM | POA: Diagnosis not present

## 2021-12-13 DIAGNOSIS — R1310 Dysphagia, unspecified: Secondary | ICD-10-CM | POA: Diagnosis not present

## 2021-12-13 DIAGNOSIS — F329 Major depressive disorder, single episode, unspecified: Secondary | ICD-10-CM | POA: Diagnosis not present

## 2021-12-13 DIAGNOSIS — G309 Alzheimer's disease, unspecified: Secondary | ICD-10-CM | POA: Diagnosis not present

## 2021-12-13 DIAGNOSIS — F0280 Dementia in other diseases classified elsewhere without behavioral disturbance: Secondary | ICD-10-CM | POA: Diagnosis not present

## 2021-12-17 DIAGNOSIS — F064 Anxiety disorder due to known physiological condition: Secondary | ICD-10-CM | POA: Diagnosis not present

## 2021-12-17 DIAGNOSIS — G301 Alzheimer's disease with late onset: Secondary | ICD-10-CM | POA: Diagnosis not present

## 2021-12-17 DIAGNOSIS — F331 Major depressive disorder, recurrent, moderate: Secondary | ICD-10-CM | POA: Diagnosis not present

## 2021-12-19 DIAGNOSIS — F329 Major depressive disorder, single episode, unspecified: Secondary | ICD-10-CM | POA: Diagnosis not present

## 2021-12-19 DIAGNOSIS — G309 Alzheimer's disease, unspecified: Secondary | ICD-10-CM | POA: Diagnosis not present

## 2021-12-19 DIAGNOSIS — R1310 Dysphagia, unspecified: Secondary | ICD-10-CM | POA: Diagnosis not present

## 2021-12-19 DIAGNOSIS — F0280 Dementia in other diseases classified elsewhere without behavioral disturbance: Secondary | ICD-10-CM | POA: Diagnosis not present

## 2021-12-19 DIAGNOSIS — Z9181 History of falling: Secondary | ICD-10-CM | POA: Diagnosis not present

## 2021-12-19 DIAGNOSIS — Z792 Long term (current) use of antibiotics: Secondary | ICD-10-CM | POA: Diagnosis not present

## 2021-12-19 DIAGNOSIS — I10 Essential (primary) hypertension: Secondary | ICD-10-CM | POA: Diagnosis not present

## 2022-01-08 DIAGNOSIS — R1310 Dysphagia, unspecified: Secondary | ICD-10-CM | POA: Diagnosis not present

## 2022-01-08 DIAGNOSIS — F329 Major depressive disorder, single episode, unspecified: Secondary | ICD-10-CM | POA: Diagnosis not present

## 2022-01-08 DIAGNOSIS — I10 Essential (primary) hypertension: Secondary | ICD-10-CM | POA: Diagnosis not present

## 2022-01-08 DIAGNOSIS — Z792 Long term (current) use of antibiotics: Secondary | ICD-10-CM | POA: Diagnosis not present

## 2022-01-08 DIAGNOSIS — Z9181 History of falling: Secondary | ICD-10-CM | POA: Diagnosis not present

## 2022-01-08 DIAGNOSIS — F0280 Dementia in other diseases classified elsewhere without behavioral disturbance: Secondary | ICD-10-CM | POA: Diagnosis not present

## 2022-01-08 DIAGNOSIS — G309 Alzheimer's disease, unspecified: Secondary | ICD-10-CM | POA: Diagnosis not present

## 2022-01-14 DIAGNOSIS — F064 Anxiety disorder due to known physiological condition: Secondary | ICD-10-CM | POA: Diagnosis not present

## 2022-01-14 DIAGNOSIS — G301 Alzheimer's disease with late onset: Secondary | ICD-10-CM | POA: Diagnosis not present

## 2022-01-14 DIAGNOSIS — F331 Major depressive disorder, recurrent, moderate: Secondary | ICD-10-CM | POA: Diagnosis not present

## 2022-01-16 DIAGNOSIS — I10 Essential (primary) hypertension: Secondary | ICD-10-CM | POA: Diagnosis not present

## 2022-01-16 DIAGNOSIS — G309 Alzheimer's disease, unspecified: Secondary | ICD-10-CM | POA: Diagnosis not present

## 2022-01-16 DIAGNOSIS — F329 Major depressive disorder, single episode, unspecified: Secondary | ICD-10-CM | POA: Diagnosis not present

## 2022-01-16 DIAGNOSIS — Z792 Long term (current) use of antibiotics: Secondary | ICD-10-CM | POA: Diagnosis not present

## 2022-01-16 DIAGNOSIS — Z9181 History of falling: Secondary | ICD-10-CM | POA: Diagnosis not present

## 2022-01-16 DIAGNOSIS — F0280 Dementia in other diseases classified elsewhere without behavioral disturbance: Secondary | ICD-10-CM | POA: Diagnosis not present

## 2022-01-16 DIAGNOSIS — R1310 Dysphagia, unspecified: Secondary | ICD-10-CM | POA: Diagnosis not present

## 2022-01-24 DIAGNOSIS — F0390 Unspecified dementia without behavioral disturbance: Secondary | ICD-10-CM | POA: Diagnosis not present

## 2022-01-24 DIAGNOSIS — W19XXXA Unspecified fall, initial encounter: Secondary | ICD-10-CM | POA: Diagnosis not present

## 2022-01-24 DIAGNOSIS — R5381 Other malaise: Secondary | ICD-10-CM | POA: Diagnosis not present

## 2022-01-24 DIAGNOSIS — R1310 Dysphagia, unspecified: Secondary | ICD-10-CM | POA: Diagnosis not present

## 2022-02-04 DIAGNOSIS — K130 Diseases of lips: Secondary | ICD-10-CM | POA: Diagnosis not present

## 2022-02-04 DIAGNOSIS — F03918 Unspecified dementia, unspecified severity, with other behavioral disturbance: Secondary | ICD-10-CM | POA: Diagnosis not present

## 2022-02-06 DIAGNOSIS — F329 Major depressive disorder, single episode, unspecified: Secondary | ICD-10-CM | POA: Diagnosis not present

## 2022-02-06 DIAGNOSIS — Z9181 History of falling: Secondary | ICD-10-CM | POA: Diagnosis not present

## 2022-02-06 DIAGNOSIS — I10 Essential (primary) hypertension: Secondary | ICD-10-CM | POA: Diagnosis not present

## 2022-02-06 DIAGNOSIS — G309 Alzheimer's disease, unspecified: Secondary | ICD-10-CM | POA: Diagnosis not present

## 2022-02-06 DIAGNOSIS — R1310 Dysphagia, unspecified: Secondary | ICD-10-CM | POA: Diagnosis not present

## 2022-02-06 DIAGNOSIS — Z792 Long term (current) use of antibiotics: Secondary | ICD-10-CM | POA: Diagnosis not present

## 2022-02-06 DIAGNOSIS — F0280 Dementia in other diseases classified elsewhere without behavioral disturbance: Secondary | ICD-10-CM | POA: Diagnosis not present

## 2022-02-11 DIAGNOSIS — F064 Anxiety disorder due to known physiological condition: Secondary | ICD-10-CM | POA: Diagnosis not present

## 2022-02-11 DIAGNOSIS — F329 Major depressive disorder, single episode, unspecified: Secondary | ICD-10-CM | POA: Diagnosis not present

## 2022-02-11 DIAGNOSIS — Z9181 History of falling: Secondary | ICD-10-CM | POA: Diagnosis not present

## 2022-02-11 DIAGNOSIS — G301 Alzheimer's disease with late onset: Secondary | ICD-10-CM | POA: Diagnosis not present

## 2022-02-11 DIAGNOSIS — F331 Major depressive disorder, recurrent, moderate: Secondary | ICD-10-CM | POA: Diagnosis not present

## 2022-02-11 DIAGNOSIS — G309 Alzheimer's disease, unspecified: Secondary | ICD-10-CM | POA: Diagnosis not present

## 2022-02-11 DIAGNOSIS — F0280 Dementia in other diseases classified elsewhere without behavioral disturbance: Secondary | ICD-10-CM | POA: Diagnosis not present

## 2022-02-11 DIAGNOSIS — I10 Essential (primary) hypertension: Secondary | ICD-10-CM | POA: Diagnosis not present

## 2022-02-11 DIAGNOSIS — R1310 Dysphagia, unspecified: Secondary | ICD-10-CM | POA: Diagnosis not present

## 2022-02-11 DIAGNOSIS — Z792 Long term (current) use of antibiotics: Secondary | ICD-10-CM | POA: Diagnosis not present

## 2022-02-17 DIAGNOSIS — W1781XA Fall down embankment (hill), initial encounter: Secondary | ICD-10-CM | POA: Diagnosis not present

## 2022-02-17 DIAGNOSIS — N390 Urinary tract infection, site not specified: Secondary | ICD-10-CM | POA: Diagnosis not present

## 2022-02-17 DIAGNOSIS — Z7401 Bed confinement status: Secondary | ICD-10-CM | POA: Diagnosis not present

## 2022-02-17 DIAGNOSIS — R4182 Altered mental status, unspecified: Secondary | ICD-10-CM | POA: Diagnosis not present

## 2022-02-17 DIAGNOSIS — W19XXXA Unspecified fall, initial encounter: Secondary | ICD-10-CM | POA: Diagnosis not present

## 2022-02-17 DIAGNOSIS — S0083XA Contusion of other part of head, initial encounter: Secondary | ICD-10-CM | POA: Diagnosis not present

## 2022-02-17 DIAGNOSIS — I959 Hypotension, unspecified: Secondary | ICD-10-CM | POA: Diagnosis not present

## 2022-02-17 DIAGNOSIS — S0003XA Contusion of scalp, initial encounter: Secondary | ICD-10-CM | POA: Diagnosis not present

## 2022-02-17 DIAGNOSIS — Z043 Encounter for examination and observation following other accident: Secondary | ICD-10-CM | POA: Diagnosis not present

## 2022-02-17 DIAGNOSIS — Y998 Other external cause status: Secondary | ICD-10-CM | POA: Diagnosis not present

## 2022-02-17 DIAGNOSIS — F0390 Unspecified dementia without behavioral disturbance: Secondary | ICD-10-CM | POA: Diagnosis not present

## 2022-02-17 DIAGNOSIS — M5030 Other cervical disc degeneration, unspecified cervical region: Secondary | ICD-10-CM | POA: Diagnosis not present

## 2022-02-19 DIAGNOSIS — F0280 Dementia in other diseases classified elsewhere without behavioral disturbance: Secondary | ICD-10-CM | POA: Diagnosis not present

## 2022-02-19 DIAGNOSIS — R1310 Dysphagia, unspecified: Secondary | ICD-10-CM | POA: Diagnosis not present

## 2022-02-19 DIAGNOSIS — I10 Essential (primary) hypertension: Secondary | ICD-10-CM | POA: Diagnosis not present

## 2022-02-19 DIAGNOSIS — Z792 Long term (current) use of antibiotics: Secondary | ICD-10-CM | POA: Diagnosis not present

## 2022-02-19 DIAGNOSIS — F329 Major depressive disorder, single episode, unspecified: Secondary | ICD-10-CM | POA: Diagnosis not present

## 2022-02-19 DIAGNOSIS — G309 Alzheimer's disease, unspecified: Secondary | ICD-10-CM | POA: Diagnosis not present

## 2022-02-19 DIAGNOSIS — Z9181 History of falling: Secondary | ICD-10-CM | POA: Diagnosis not present

## 2022-02-21 DIAGNOSIS — F0390 Unspecified dementia without behavioral disturbance: Secondary | ICD-10-CM | POA: Diagnosis not present

## 2022-02-21 DIAGNOSIS — N390 Urinary tract infection, site not specified: Secondary | ICD-10-CM | POA: Diagnosis not present

## 2022-02-21 DIAGNOSIS — W19XXXA Unspecified fall, initial encounter: Secondary | ICD-10-CM | POA: Diagnosis not present

## 2022-02-21 DIAGNOSIS — S0990XA Unspecified injury of head, initial encounter: Secondary | ICD-10-CM | POA: Diagnosis not present

## 2022-03-04 DIAGNOSIS — R197 Diarrhea, unspecified: Secondary | ICD-10-CM | POA: Diagnosis not present

## 2022-03-04 DIAGNOSIS — F0390 Unspecified dementia without behavioral disturbance: Secondary | ICD-10-CM | POA: Diagnosis not present

## 2022-03-04 DIAGNOSIS — R4182 Altered mental status, unspecified: Secondary | ICD-10-CM | POA: Diagnosis not present

## 2022-03-05 DIAGNOSIS — Z9181 History of falling: Secondary | ICD-10-CM | POA: Diagnosis not present

## 2022-03-05 DIAGNOSIS — F0280 Dementia in other diseases classified elsewhere without behavioral disturbance: Secondary | ICD-10-CM | POA: Diagnosis not present

## 2022-03-05 DIAGNOSIS — Z792 Long term (current) use of antibiotics: Secondary | ICD-10-CM | POA: Diagnosis not present

## 2022-03-05 DIAGNOSIS — F329 Major depressive disorder, single episode, unspecified: Secondary | ICD-10-CM | POA: Diagnosis not present

## 2022-03-05 DIAGNOSIS — R1310 Dysphagia, unspecified: Secondary | ICD-10-CM | POA: Diagnosis not present

## 2022-03-05 DIAGNOSIS — I10 Essential (primary) hypertension: Secondary | ICD-10-CM | POA: Diagnosis not present

## 2022-03-05 DIAGNOSIS — G309 Alzheimer's disease, unspecified: Secondary | ICD-10-CM | POA: Diagnosis not present

## 2022-03-06 DIAGNOSIS — I10 Essential (primary) hypertension: Secondary | ICD-10-CM | POA: Diagnosis not present

## 2022-03-06 DIAGNOSIS — E782 Mixed hyperlipidemia: Secondary | ICD-10-CM | POA: Diagnosis not present

## 2022-03-06 DIAGNOSIS — D649 Anemia, unspecified: Secondary | ICD-10-CM | POA: Diagnosis not present

## 2022-03-11 DIAGNOSIS — F064 Anxiety disorder due to known physiological condition: Secondary | ICD-10-CM | POA: Diagnosis not present

## 2022-03-11 DIAGNOSIS — F331 Major depressive disorder, recurrent, moderate: Secondary | ICD-10-CM | POA: Diagnosis not present

## 2022-03-11 DIAGNOSIS — G301 Alzheimer's disease with late onset: Secondary | ICD-10-CM | POA: Diagnosis not present

## 2022-03-12 DIAGNOSIS — R4182 Altered mental status, unspecified: Secondary | ICD-10-CM | POA: Diagnosis not present

## 2022-04-07 DIAGNOSIS — I10 Essential (primary) hypertension: Secondary | ICD-10-CM | POA: Diagnosis not present

## 2022-04-07 DIAGNOSIS — E039 Hypothyroidism, unspecified: Secondary | ICD-10-CM | POA: Diagnosis not present

## 2022-04-08 DIAGNOSIS — R9431 Abnormal electrocardiogram [ECG] [EKG]: Secondary | ICD-10-CM | POA: Diagnosis not present

## 2022-04-08 DIAGNOSIS — S0081XA Abrasion of other part of head, initial encounter: Secondary | ICD-10-CM | POA: Diagnosis not present

## 2022-04-08 DIAGNOSIS — Z743 Need for continuous supervision: Secondary | ICD-10-CM | POA: Diagnosis not present

## 2022-04-08 DIAGNOSIS — R531 Weakness: Secondary | ICD-10-CM | POA: Diagnosis not present

## 2022-04-08 DIAGNOSIS — W19XXXA Unspecified fall, initial encounter: Secondary | ICD-10-CM | POA: Diagnosis not present

## 2022-04-08 DIAGNOSIS — R4182 Altered mental status, unspecified: Secondary | ICD-10-CM | POA: Diagnosis not present

## 2022-04-08 DIAGNOSIS — R55 Syncope and collapse: Secondary | ICD-10-CM | POA: Diagnosis not present

## 2022-04-08 DIAGNOSIS — S199XXA Unspecified injury of neck, initial encounter: Secondary | ICD-10-CM | POA: Diagnosis not present

## 2022-04-08 DIAGNOSIS — R279 Unspecified lack of coordination: Secondary | ICD-10-CM | POA: Diagnosis not present

## 2022-04-08 DIAGNOSIS — W010XXA Fall on same level from slipping, tripping and stumbling without subsequent striking against object, initial encounter: Secondary | ICD-10-CM | POA: Diagnosis not present

## 2022-04-08 DIAGNOSIS — Y998 Other external cause status: Secondary | ICD-10-CM | POA: Diagnosis not present

## 2022-04-08 DIAGNOSIS — E041 Nontoxic single thyroid nodule: Secondary | ICD-10-CM | POA: Diagnosis not present

## 2022-04-08 DIAGNOSIS — S0003XA Contusion of scalp, initial encounter: Secondary | ICD-10-CM | POA: Diagnosis not present

## 2022-04-08 DIAGNOSIS — M47812 Spondylosis without myelopathy or radiculopathy, cervical region: Secondary | ICD-10-CM | POA: Diagnosis not present

## 2022-04-08 DIAGNOSIS — G319 Degenerative disease of nervous system, unspecified: Secondary | ICD-10-CM | POA: Diagnosis not present

## 2022-04-08 DIAGNOSIS — J984 Other disorders of lung: Secondary | ICD-10-CM | POA: Diagnosis not present

## 2022-04-11 DIAGNOSIS — L539 Erythematous condition, unspecified: Secondary | ICD-10-CM | POA: Diagnosis not present

## 2022-04-11 DIAGNOSIS — R5383 Other fatigue: Secondary | ICD-10-CM | POA: Diagnosis not present

## 2022-04-11 DIAGNOSIS — S0003XA Contusion of scalp, initial encounter: Secondary | ICD-10-CM | POA: Diagnosis not present

## 2022-04-11 DIAGNOSIS — S0990XA Unspecified injury of head, initial encounter: Secondary | ICD-10-CM | POA: Diagnosis not present

## 2022-04-15 DIAGNOSIS — S0990XA Unspecified injury of head, initial encounter: Secondary | ICD-10-CM | POA: Diagnosis not present

## 2022-04-15 DIAGNOSIS — S0003XA Contusion of scalp, initial encounter: Secondary | ICD-10-CM | POA: Diagnosis not present

## 2022-04-15 DIAGNOSIS — R5383 Other fatigue: Secondary | ICD-10-CM | POA: Diagnosis not present

## 2022-04-15 DIAGNOSIS — F0390 Unspecified dementia without behavioral disturbance: Secondary | ICD-10-CM | POA: Diagnosis not present

## 2022-04-22 DIAGNOSIS — F064 Anxiety disorder due to known physiological condition: Secondary | ICD-10-CM | POA: Diagnosis not present

## 2022-04-22 DIAGNOSIS — F331 Major depressive disorder, recurrent, moderate: Secondary | ICD-10-CM | POA: Diagnosis not present

## 2022-04-22 DIAGNOSIS — G301 Alzheimer's disease with late onset: Secondary | ICD-10-CM | POA: Diagnosis not present

## 2022-05-08 DIAGNOSIS — T1490XA Injury, unspecified, initial encounter: Secondary | ICD-10-CM | POA: Diagnosis not present

## 2022-05-08 DIAGNOSIS — Z23 Encounter for immunization: Secondary | ICD-10-CM | POA: Diagnosis not present

## 2022-05-08 DIAGNOSIS — W19XXXA Unspecified fall, initial encounter: Secondary | ICD-10-CM | POA: Diagnosis not present

## 2022-05-08 DIAGNOSIS — W010XXA Fall on same level from slipping, tripping and stumbling without subsequent striking against object, initial encounter: Secondary | ICD-10-CM | POA: Diagnosis not present

## 2022-05-08 DIAGNOSIS — S0003XA Contusion of scalp, initial encounter: Secondary | ICD-10-CM | POA: Diagnosis not present

## 2022-05-08 DIAGNOSIS — S80212A Abrasion, left knee, initial encounter: Secondary | ICD-10-CM | POA: Diagnosis not present

## 2022-05-08 DIAGNOSIS — R58 Hemorrhage, not elsewhere classified: Secondary | ICD-10-CM | POA: Diagnosis not present

## 2022-05-08 DIAGNOSIS — Y998 Other external cause status: Secondary | ICD-10-CM | POA: Diagnosis not present

## 2022-05-08 DIAGNOSIS — Z7401 Bed confinement status: Secondary | ICD-10-CM | POA: Diagnosis not present

## 2022-05-08 DIAGNOSIS — I10 Essential (primary) hypertension: Secondary | ICD-10-CM | POA: Diagnosis not present

## 2022-05-08 DIAGNOSIS — S0181XA Laceration without foreign body of other part of head, initial encounter: Secondary | ICD-10-CM | POA: Diagnosis not present

## 2022-05-08 DIAGNOSIS — R404 Transient alteration of awareness: Secondary | ICD-10-CM | POA: Diagnosis not present

## 2022-05-08 DIAGNOSIS — S0990XA Unspecified injury of head, initial encounter: Secondary | ICD-10-CM | POA: Diagnosis not present

## 2022-05-09 DIAGNOSIS — W19XXXA Unspecified fall, initial encounter: Secondary | ICD-10-CM | POA: Diagnosis not present

## 2022-05-09 DIAGNOSIS — F0390 Unspecified dementia without behavioral disturbance: Secondary | ICD-10-CM | POA: Diagnosis not present

## 2022-05-09 DIAGNOSIS — S0181XA Laceration without foreign body of other part of head, initial encounter: Secondary | ICD-10-CM | POA: Diagnosis not present

## 2022-05-09 DIAGNOSIS — S0990XA Unspecified injury of head, initial encounter: Secondary | ICD-10-CM | POA: Diagnosis not present

## 2022-05-20 DIAGNOSIS — G301 Alzheimer's disease with late onset: Secondary | ICD-10-CM | POA: Diagnosis not present

## 2022-05-20 DIAGNOSIS — F331 Major depressive disorder, recurrent, moderate: Secondary | ICD-10-CM | POA: Diagnosis not present

## 2022-05-20 DIAGNOSIS — F064 Anxiety disorder due to known physiological condition: Secondary | ICD-10-CM | POA: Diagnosis not present

## 2022-06-02 DIAGNOSIS — Z043 Encounter for examination and observation following other accident: Secondary | ICD-10-CM | POA: Diagnosis not present

## 2022-06-02 DIAGNOSIS — I10 Essential (primary) hypertension: Secondary | ICD-10-CM | POA: Diagnosis not present

## 2022-06-02 DIAGNOSIS — I6782 Cerebral ischemia: Secondary | ICD-10-CM | POA: Diagnosis not present

## 2022-06-02 DIAGNOSIS — S0990XA Unspecified injury of head, initial encounter: Secondary | ICD-10-CM | POA: Diagnosis not present

## 2022-06-02 DIAGNOSIS — Y999 Unspecified external cause status: Secondary | ICD-10-CM | POA: Diagnosis not present

## 2022-06-02 DIAGNOSIS — R404 Transient alteration of awareness: Secondary | ICD-10-CM | POA: Diagnosis not present

## 2022-06-02 DIAGNOSIS — M47812 Spondylosis without myelopathy or radiculopathy, cervical region: Secondary | ICD-10-CM | POA: Diagnosis not present

## 2022-06-02 DIAGNOSIS — Z87891 Personal history of nicotine dependence: Secondary | ICD-10-CM | POA: Diagnosis not present

## 2022-06-02 DIAGNOSIS — W19XXXA Unspecified fall, initial encounter: Secondary | ICD-10-CM | POA: Diagnosis not present

## 2022-06-02 DIAGNOSIS — W01198A Fall on same level from slipping, tripping and stumbling with subsequent striking against other object, initial encounter: Secondary | ICD-10-CM | POA: Diagnosis not present

## 2022-06-03 DIAGNOSIS — S0990XA Unspecified injury of head, initial encounter: Secondary | ICD-10-CM | POA: Diagnosis not present

## 2022-06-03 DIAGNOSIS — F03918 Unspecified dementia, unspecified severity, with other behavioral disturbance: Secondary | ICD-10-CM | POA: Diagnosis not present

## 2022-06-03 DIAGNOSIS — F0390 Unspecified dementia without behavioral disturbance: Secondary | ICD-10-CM | POA: Diagnosis not present

## 2022-06-03 DIAGNOSIS — W19XXXA Unspecified fall, initial encounter: Secondary | ICD-10-CM | POA: Diagnosis not present

## 2022-06-17 DIAGNOSIS — F331 Major depressive disorder, recurrent, moderate: Secondary | ICD-10-CM | POA: Diagnosis not present

## 2022-06-17 DIAGNOSIS — F02B3 Dementia in other diseases classified elsewhere, moderate, with mood disturbance: Secondary | ICD-10-CM | POA: Diagnosis not present

## 2022-06-17 DIAGNOSIS — F02C11 Dementia in other diseases classified elsewhere, severe, with agitation: Secondary | ICD-10-CM | POA: Diagnosis not present

## 2022-06-17 DIAGNOSIS — G309 Alzheimer's disease, unspecified: Secondary | ICD-10-CM | POA: Diagnosis not present

## 2022-06-17 DIAGNOSIS — G301 Alzheimer's disease with late onset: Secondary | ICD-10-CM | POA: Diagnosis not present

## 2022-06-17 DIAGNOSIS — I10 Essential (primary) hypertension: Secondary | ICD-10-CM | POA: Diagnosis not present

## 2022-06-17 DIAGNOSIS — F064 Anxiety disorder due to known physiological condition: Secondary | ICD-10-CM | POA: Diagnosis not present

## 2022-07-14 DIAGNOSIS — D649 Anemia, unspecified: Secondary | ICD-10-CM | POA: Diagnosis not present

## 2022-07-15 DIAGNOSIS — G301 Alzheimer's disease with late onset: Secondary | ICD-10-CM | POA: Diagnosis not present

## 2022-07-15 DIAGNOSIS — F331 Major depressive disorder, recurrent, moderate: Secondary | ICD-10-CM | POA: Diagnosis not present

## 2022-07-15 DIAGNOSIS — F419 Anxiety disorder, unspecified: Secondary | ICD-10-CM | POA: Diagnosis not present

## 2022-07-15 DIAGNOSIS — F02B3 Dementia in other diseases classified elsewhere, moderate, with mood disturbance: Secondary | ICD-10-CM | POA: Diagnosis not present

## 2022-07-22 DIAGNOSIS — F03C0 Unspecified dementia, severe, without behavioral disturbance, psychotic disturbance, mood disturbance, and anxiety: Secondary | ICD-10-CM | POA: Diagnosis not present

## 2022-07-22 DIAGNOSIS — N950 Postmenopausal bleeding: Secondary | ICD-10-CM | POA: Diagnosis not present

## 2022-07-22 DIAGNOSIS — B029 Zoster without complications: Secondary | ICD-10-CM | POA: Diagnosis not present

## 2022-08-05 DIAGNOSIS — G309 Alzheimer's disease, unspecified: Secondary | ICD-10-CM | POA: Diagnosis not present

## 2022-08-05 DIAGNOSIS — F0280 Dementia in other diseases classified elsewhere without behavioral disturbance: Secondary | ICD-10-CM | POA: Diagnosis not present

## 2022-08-12 DIAGNOSIS — Z9183 Wandering in diseases classified elsewhere: Secondary | ICD-10-CM | POA: Diagnosis not present

## 2022-08-12 DIAGNOSIS — F331 Major depressive disorder, recurrent, moderate: Secondary | ICD-10-CM | POA: Diagnosis not present

## 2022-08-12 DIAGNOSIS — L710 Perioral dermatitis: Secondary | ICD-10-CM | POA: Diagnosis not present

## 2022-08-12 DIAGNOSIS — G301 Alzheimer's disease with late onset: Secondary | ICD-10-CM | POA: Diagnosis not present

## 2022-08-12 DIAGNOSIS — F419 Anxiety disorder, unspecified: Secondary | ICD-10-CM | POA: Diagnosis not present

## 2022-08-12 DIAGNOSIS — F02C18 Dementia in other diseases classified elsewhere, severe, with other behavioral disturbance: Secondary | ICD-10-CM | POA: Diagnosis not present

## 2022-08-12 DIAGNOSIS — G300 Alzheimer's disease with early onset: Secondary | ICD-10-CM | POA: Diagnosis not present

## 2022-09-04 DIAGNOSIS — D649 Anemia, unspecified: Secondary | ICD-10-CM | POA: Diagnosis not present

## 2022-09-04 DIAGNOSIS — I10 Essential (primary) hypertension: Secondary | ICD-10-CM | POA: Diagnosis not present

## 2022-09-04 DIAGNOSIS — E7800 Pure hypercholesterolemia, unspecified: Secondary | ICD-10-CM | POA: Diagnosis not present

## 2022-09-04 DIAGNOSIS — E039 Hypothyroidism, unspecified: Secondary | ICD-10-CM | POA: Diagnosis not present

## 2022-09-04 DIAGNOSIS — E782 Mixed hyperlipidemia: Secondary | ICD-10-CM | POA: Diagnosis not present

## 2022-09-09 DIAGNOSIS — F419 Anxiety disorder, unspecified: Secondary | ICD-10-CM | POA: Diagnosis not present

## 2022-09-09 DIAGNOSIS — F331 Major depressive disorder, recurrent, moderate: Secondary | ICD-10-CM | POA: Diagnosis not present

## 2022-09-09 DIAGNOSIS — F02B3 Dementia in other diseases classified elsewhere, moderate, with mood disturbance: Secondary | ICD-10-CM | POA: Diagnosis not present

## 2022-09-09 DIAGNOSIS — G301 Alzheimer's disease with late onset: Secondary | ICD-10-CM | POA: Diagnosis not present

## 2022-10-07 DIAGNOSIS — G309 Alzheimer's disease, unspecified: Secondary | ICD-10-CM | POA: Diagnosis not present

## 2022-10-07 DIAGNOSIS — F331 Major depressive disorder, recurrent, moderate: Secondary | ICD-10-CM | POA: Diagnosis not present

## 2022-10-07 DIAGNOSIS — F419 Anxiety disorder, unspecified: Secondary | ICD-10-CM | POA: Diagnosis not present

## 2022-10-07 DIAGNOSIS — F0280 Dementia in other diseases classified elsewhere without behavioral disturbance: Secondary | ICD-10-CM | POA: Diagnosis not present

## 2022-10-31 DIAGNOSIS — E039 Hypothyroidism, unspecified: Secondary | ICD-10-CM | POA: Diagnosis not present

## 2022-10-31 DIAGNOSIS — G309 Alzheimer's disease, unspecified: Secondary | ICD-10-CM | POA: Diagnosis not present

## 2022-11-04 DIAGNOSIS — F419 Anxiety disorder, unspecified: Secondary | ICD-10-CM | POA: Diagnosis not present

## 2022-11-04 DIAGNOSIS — F0280 Dementia in other diseases classified elsewhere without behavioral disturbance: Secondary | ICD-10-CM | POA: Diagnosis not present

## 2022-11-04 DIAGNOSIS — G309 Alzheimer's disease, unspecified: Secondary | ICD-10-CM | POA: Diagnosis not present

## 2022-11-04 DIAGNOSIS — F331 Major depressive disorder, recurrent, moderate: Secondary | ICD-10-CM | POA: Diagnosis not present

## 2022-12-02 DIAGNOSIS — F419 Anxiety disorder, unspecified: Secondary | ICD-10-CM | POA: Diagnosis not present

## 2022-12-02 DIAGNOSIS — F0280 Dementia in other diseases classified elsewhere without behavioral disturbance: Secondary | ICD-10-CM | POA: Diagnosis not present

## 2022-12-02 DIAGNOSIS — G309 Alzheimer's disease, unspecified: Secondary | ICD-10-CM | POA: Diagnosis not present

## 2022-12-02 DIAGNOSIS — F331 Major depressive disorder, recurrent, moderate: Secondary | ICD-10-CM | POA: Diagnosis not present

## 2023-12-05 DEATH — deceased
